# Patient Record
Sex: Male | Born: 1985 | Race: Black or African American | Hispanic: No | Marital: Single | Smoking: Never smoker
Health system: Southern US, Community
[De-identification: ages and names within clinical notes are randomized; demographics above are authoritative.]

## PROBLEM LIST (undated history)

## (undated) DIAGNOSIS — K219 Gastro-esophageal reflux disease without esophagitis: Secondary | ICD-10-CM

## (undated) HISTORY — PX: UPPER GASTROINTESTINAL ENDOSCOPY: SHX188

## (undated) HISTORY — DX: Gastro-esophageal reflux disease without esophagitis: K21.9

---
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
## 2003-08-11 ENCOUNTER — Emergency Department (HOSPITAL_COMMUNITY): Admission: EM | Admit: 2003-08-11 | Discharge: 2003-08-11 | Payer: Self-pay | Admitting: Emergency Medicine

## 2004-07-08 ENCOUNTER — Ambulatory Visit: Payer: Self-pay | Admitting: Internal Medicine

## 2004-07-14 ENCOUNTER — Ambulatory Visit: Payer: Self-pay | Admitting: *Deleted

## 2010-06-14 ENCOUNTER — Ambulatory Visit (HOSPITAL_COMMUNITY)
Admission: RE | Admit: 2010-06-14 | Discharge: 2010-06-14 | Disposition: A | Discharge status: Home / Self Care | Payer: Self-pay | Source: Ambulatory Visit | Attending: Family Medicine | Admitting: Family Medicine

## 2010-06-14 ENCOUNTER — Other Ambulatory Visit (HOSPITAL_COMMUNITY): Payer: Self-pay | Admitting: Family Medicine

## 2010-06-14 DIAGNOSIS — M25519 Pain in unspecified shoulder: Secondary | ICD-10-CM | POA: Insufficient documentation

## 2010-06-14 DIAGNOSIS — R52 Pain, unspecified: Secondary | ICD-10-CM

## 2010-08-18 ENCOUNTER — Ambulatory Visit: Payer: Self-pay | Attending: Family Medicine | Admitting: Physical Therapy

## 2010-08-18 DIAGNOSIS — M25619 Stiffness of unspecified shoulder, not elsewhere classified: Secondary | ICD-10-CM | POA: Insufficient documentation

## 2010-08-18 DIAGNOSIS — M25519 Pain in unspecified shoulder: Secondary | ICD-10-CM | POA: Insufficient documentation

## 2010-08-18 DIAGNOSIS — IMO0001 Reserved for inherently not codable concepts without codable children: Secondary | ICD-10-CM | POA: Insufficient documentation

## 2010-08-23 ENCOUNTER — Ambulatory Visit: Payer: Self-pay | Admitting: Physical Therapy

## 2010-08-25 ENCOUNTER — Ambulatory Visit: Payer: Self-pay | Admitting: Physical Therapy

## 2010-08-31 ENCOUNTER — Ambulatory Visit: Payer: Self-pay | Admitting: Physical Therapy

## 2010-09-02 ENCOUNTER — Ambulatory Visit: Payer: Self-pay | Admitting: Physical Therapy

## 2010-09-07 ENCOUNTER — Ambulatory Visit: Payer: Self-pay | Attending: Family Medicine | Admitting: Physical Therapy

## 2010-09-07 DIAGNOSIS — M25519 Pain in unspecified shoulder: Secondary | ICD-10-CM | POA: Insufficient documentation

## 2010-09-07 DIAGNOSIS — M25619 Stiffness of unspecified shoulder, not elsewhere classified: Secondary | ICD-10-CM | POA: Insufficient documentation

## 2010-09-07 DIAGNOSIS — IMO0001 Reserved for inherently not codable concepts without codable children: Secondary | ICD-10-CM | POA: Insufficient documentation

## 2010-09-10 ENCOUNTER — Ambulatory Visit: Payer: Self-pay | Admitting: Physical Therapy

## 2010-09-14 ENCOUNTER — Ambulatory Visit: Payer: Self-pay | Admitting: Physical Therapy

## 2010-09-16 ENCOUNTER — Encounter: Payer: Self-pay | Admitting: Physical Therapy

## 2010-09-21 ENCOUNTER — Ambulatory Visit: Payer: Self-pay | Admitting: Physical Therapy

## 2010-09-23 ENCOUNTER — Encounter: Payer: Self-pay | Admitting: Physical Therapy

## 2012-11-02 ENCOUNTER — Encounter (HOSPITAL_COMMUNITY): Payer: Self-pay | Admitting: Emergency Medicine

## 2012-11-02 ENCOUNTER — Emergency Department (HOSPITAL_COMMUNITY)
Admission: EM | Admit: 2012-11-02 | Discharge: 2012-11-02 | Disposition: A | Discharge status: Home / Self Care | Payer: No Typology Code available for payment source | Attending: Emergency Medicine | Admitting: Emergency Medicine

## 2012-11-02 DIAGNOSIS — Y92410 Unspecified street and highway as the place of occurrence of the external cause: Secondary | ICD-10-CM | POA: Insufficient documentation

## 2012-11-02 DIAGNOSIS — IMO0002 Reserved for concepts with insufficient information to code with codable children: Secondary | ICD-10-CM | POA: Insufficient documentation

## 2012-11-02 DIAGNOSIS — Y9389 Activity, other specified: Secondary | ICD-10-CM | POA: Insufficient documentation

## 2012-11-02 DIAGNOSIS — M549 Dorsalgia, unspecified: Secondary | ICD-10-CM

## 2012-11-02 MED ORDER — HYDROCODONE-ACETAMINOPHEN 5-325 MG PO TABS: 2.0000 | ORAL_TABLET | ORAL | Status: DC | PRN

## 2012-11-02 MED ORDER — NAPROXEN 500 MG PO TABS: 500.0000 mg | ORAL_TABLET | Freq: Two times a day (BID) | ORAL | Status: DC

## 2012-11-02 MED ORDER — METHOCARBAMOL 500 MG PO TABS: 500.0000 mg | ORAL_TABLET | Freq: Two times a day (BID) | ORAL | Status: DC
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
## 2012-11-02 NOTE — ED Provider Notes (Signed)
Medical screening examination/treatment/procedure(s) were performed by non-physician practitioner and as supervising physician I was immediately available for consultation/collaboration.   Hurman Horn, MD 11/02/12 [phone]
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
## 2017-01-11 ENCOUNTER — Encounter (HOSPITAL_COMMUNITY): Payer: Self-pay | Admitting: Emergency Medicine

## 2017-01-11 ENCOUNTER — Ambulatory Visit (HOSPITAL_COMMUNITY)
Admission: EM | Admit: 2017-01-11 | Discharge: 2017-01-11 | Disposition: A | Discharge status: Home / Self Care | Payer: Self-pay | Attending: Family Medicine | Admitting: Family Medicine

## 2017-01-11 DIAGNOSIS — M542 Cervicalgia: Secondary | ICD-10-CM

## 2017-01-11 DIAGNOSIS — S39012A Strain of muscle, fascia and tendon of lower back, initial encounter: Secondary | ICD-10-CM

## 2017-01-11 MED ORDER — NAPROXEN 500 MG PO TABS: 500.0000 mg | ORAL_TABLET | Freq: Two times a day (BID) | ORAL | 0 refills | Status: DC | PRN

## 2017-01-11 MED ORDER — CYCLOBENZAPRINE HCL 10 MG PO TABS: ORAL_TABLET | ORAL | 0 refills | Status: DC
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
## 2017-01-11 NOTE — Discharge Instructions (Addendum)
Recommend start Naproxen  twice a day as directed for pain and swelling. May take Flexeril muscle relaxer- take 1/2 to 1 whole tablet by  mouth up to 3 times a day as needed- will cause drowsiness. Recommend apply warm compresses to area to help with comfort. Follow-up in 5 to 7 days for recheck if not improving.
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
## 2017-04-03 ENCOUNTER — Ambulatory Visit (HOSPITAL_COMMUNITY)
Admission: EM | Admit: 2017-04-03 | Discharge: 2017-04-03 | Disposition: A | Discharge status: Home / Self Care | Payer: BLUE CROSS/BLUE SHIELD | Attending: Family Medicine | Admitting: Family Medicine

## 2017-04-03 ENCOUNTER — Other Ambulatory Visit: Payer: Self-pay

## 2017-04-03 ENCOUNTER — Encounter (HOSPITAL_COMMUNITY): Payer: Self-pay | Admitting: Emergency Medicine

## 2017-04-03 DIAGNOSIS — Z113 Encounter for screening for infections with a predominantly sexual mode of transmission: Secondary | ICD-10-CM | POA: Diagnosis not present

## 2017-04-03 DIAGNOSIS — Z202 Contact with and (suspected) exposure to infections with a predominantly sexual mode of transmission: Secondary | ICD-10-CM | POA: Diagnosis not present

## 2017-04-03 DIAGNOSIS — R369 Urethral discharge, unspecified: Secondary | ICD-10-CM | POA: Diagnosis not present

## 2017-04-03 MED ORDER — AZITHROMYCIN 250 MG PO TABS
ORAL_TABLET | ORAL | Status: AC
  Filled 2017-04-03: qty 4

## 2017-04-03 MED ORDER — CEFTRIAXONE SODIUM 250 MG IJ SOLR
250.0000 mg | Freq: Once | INTRAMUSCULAR | Status: AC
  Administered 2017-04-03: 250 mg via INTRAMUSCULAR

## 2017-04-03 MED ORDER — CEFTRIAXONE SODIUM 250 MG IJ SOLR
INTRAMUSCULAR | Status: AC
  Filled 2017-04-03: qty 250

## 2017-04-03 MED ORDER — AZITHROMYCIN 250 MG PO TABS
1000.0000 mg | ORAL_TABLET | Freq: Once | ORAL | Status: AC
  Administered 2017-04-03: 1000 mg via ORAL

## 2017-04-03 NOTE — ED Triage Notes (Signed)
Pt states his gf got tested and was positive for gonhorrea, c/o discharge and stomach pain. x1 week.

## 2017-04-03 NOTE — Discharge Instructions (Signed)
You have been given the following medications today for treatment of suspected gonorrhea and/or chlamydia: ° °cefTRIAXone (ROCEPHIN) injection 250 mg °azithromycin (ZITHROMAX) tablet 1,000 mg ° °Even though we have treated you today, we have sent testing for sexually transmitted infections. We will notify you of any positive results once they are received. If required, we will prescribe any medications you might need. ° °

## 2017-04-04 NOTE — ED Provider Notes (Signed)
  [facility]   [id number] 04/03/17 Arrival Time: 1007  ASSESSMENT & PLAN:  1. Exposure to STD     Meds ordered this encounter  Medications  . cefTRIAXone (ROCEPHIN) injection 250 mg  . azithromycin (ZITHROMAX) tablet 1,000 mg   Urine cytology sent. Will notify of any positive results. Instructed to refrain from sexual activity for at least seven days.  Reviewed expectations re: course of current medical issues. Questions answered. Outlined signs and symptoms indicating need for more acute intervention. Patient verbalized understanding. After Visit Summary given.   SUBJECTIVE:  Brandon Olson is a 32 y.o. male who presents with complaint of penile discharge. Onset gradual, 1 week ago. Describes discharge as thick and white/yellow. Urinary symptoms: none. Afebrile. No abdominal or pelvic pain. No n/v. No rashes or lesions. Sexually active with single male partner. OTC treatment: None. Girlfriend has tested + for gonorrhea. History of similar symptoms: No.   ROS: As per HPI.  OBJECTIVE:  Vitals:   04/03/17 1044  BP: 119/71  Pulse: (!) 57  Resp: 16  Temp: 98.4 F (36.9 C)  SpO2: 100%     General appearance: alert, cooperative, appears stated age and no distress Throat: lips, mucosa, and tongue normal; teeth and gums normal Back: no CVA tenderness Abdomen: soft, non-tender; bowel sounds normal; no masses or organomegaly; no guarding or rebound tenderness GU: declines Skin: warm and dry Psychological:  Alert and cooperative. Normal mood and affect.    Labs Reviewed  URINE CYTOLOGY ANCILLARY ONLY     Allergies  Allergen Reactions  . Shrimp [Shellfish Allergy] Nausea Only    Social History   Socioeconomic History  . Marital status: Single    Spouse name: Not on file  . Number of children: Not on file  . Years of education: Not on file  . Highest education level: Not on file  Social Needs  . Financial resource strain: Not on file  . Food  insecurity - worry: Not on file  . Food insecurity - inability: Not on file  . Transportation needs - medical: Not on file  . Transportation needs - non-medical: Not on file  Occupational History  . Not on file  Tobacco Use  . Smoking status: Never Smoker  . Smokeless tobacco: Never Used  Substance and Sexual Activity  . Alcohol use: Yes  . Drug use: No  . Sexual activity: Not on file  Other Topics Concern  . Not on file  Social History Narrative  . Not on file          Mardella LaymanHagler, Germain Koopmann, MD 04/04/17 1007

## 2017-04-05 LAB — URINE CYTOLOGY ANCILLARY ONLY
CHLAMYDIA, DNA PROBE: NEGATIVE
NEISSERIA GONORRHEA: NEGATIVE
TRICH (WINDOWPATH): NEGATIVE

## 2017-04-13 ENCOUNTER — Ambulatory Visit (HOSPITAL_COMMUNITY)
Admission: EM | Admit: 2017-04-13 | Discharge: 2017-04-13 | Disposition: A | Discharge status: Home / Self Care | Payer: BLUE CROSS/BLUE SHIELD | Attending: Family Medicine | Admitting: Family Medicine

## 2017-04-13 ENCOUNTER — Other Ambulatory Visit: Payer: Self-pay

## 2017-04-13 ENCOUNTER — Encounter (HOSPITAL_COMMUNITY): Payer: Self-pay | Admitting: Emergency Medicine

## 2017-04-13 DIAGNOSIS — R369 Urethral discharge, unspecified: Secondary | ICD-10-CM | POA: Diagnosis not present

## 2017-04-13 DIAGNOSIS — R809 Proteinuria, unspecified: Secondary | ICD-10-CM | POA: Diagnosis not present

## 2017-04-13 DIAGNOSIS — R195 Other fecal abnormalities: Secondary | ICD-10-CM | POA: Diagnosis not present

## 2017-04-13 LAB — POCT I-STAT, CHEM 8
BUN: 17 mg/dL (ref 6–20)
CHLORIDE: 102 mmol/L (ref 101–111)
Calcium, Ion: 1.23 mmol/L (ref 1.15–1.40)
Creatinine, Ser: 1.2 mg/dL (ref 0.61–1.24)
GLUCOSE: 82 mg/dL (ref 65–99)
HCT: 41 % (ref 39.0–52.0)
Hemoglobin: 13.9 g/dL (ref 13.0–17.0)
POTASSIUM: 3.8 mmol/L (ref 3.5–5.1)
SODIUM: 141 mmol/L (ref 135–145)
TCO2: 27 mmol/L (ref 22–32)

## 2017-04-13 LAB — POCT URINALYSIS DIP (DEVICE)
BILIRUBIN URINE: NEGATIVE
Glucose, UA: NEGATIVE mg/dL
Ketones, ur: NEGATIVE mg/dL
LEUKOCYTES UA: NEGATIVE
Nitrite: NEGATIVE
Protein, ur: >=300 mg/dL — AB
Specific Gravity, Urine: >=1.03 (ref 1.005–1.030)
Urobilinogen, UA: 1 mg/dL (ref 0.0–1.0)
pH: 6 (ref 5.0–8.0)

## 2017-04-13 MED ORDER — SULFAMETHOXAZOLE-TRIMETHOPRIM 800-160 MG PO TABS: 1.0000 | ORAL_TABLET | Freq: Two times a day (BID) | ORAL | 0 refills | Status: AC

## 2017-04-13 NOTE — ED Provider Notes (Signed)
[facility]   [id number] 04/13/17 Arrival Time: 1607  ASSESSMENT & PLAN:  1. Loose stools   2. Penile discharge   3. Proteinuria, unspecified type     Meds ordered this encounter  Medications  . sulfamethoxazole-trimethoprim (BACTRIM DS,SEPTRA DS) 800-160 MG tablet    Sig: Take 1 tablet by mouth 2 (two) times daily for 10 days.    Dispense:  20 tablet    Refill:  0   Question if scant penile drainage prostate related. Recently empirically treated for gonorrhea and testing for gonorrhea returned negative.  I am not sure what is causing his loose stools. Discussed diet modification. As for proteinuria, I recommend that he establish care with a PCP for further evaluation. Kidney function normal tonight.  Reviewed expectations re: course of current medical issues. Questions answered. Outlined signs and symptoms indicating need for more acute intervention. Patient verbalized understanding. After Visit Summary given.   SUBJECTIVE: History from: patient. Brandon Olson is a 32 y.o. male who presents with complaint of continued penile discharge. Describes as clear and sometimes slightly white. Notices more after he is sitting for a bowel movement. No bleeding. BMs are normal. No rashes. Recently tx for GC/Chlamydia and testing returned negative. No urinary symptoms. Afebrile. No abdominal symptoms. Normal PO intake.  Does mention that stools have been "looser than normal" for a few weeks. No change in frequency. No diet changes.  No OTC treatment.  ROS: As per HPI.   OBJECTIVE:  Vitals:   04/13/17 1631  BP: 126/80  Pulse: 60  Resp: 18  Temp: 98.7 F (37.1 C)  SpO2: 100%    General appearance: alert; no distress Abdomen: soft, non-tender; bowel sounds normal; no masses or organomegaly; no guarding or rebound tenderness Back: no CVA tenderness Prostate: declined Extremities: no cyanosis or edema; symmetrical with no gross deformities Skin: warm and  dry Psychological: alert and cooperative; normal mood and affect  Labs: Results for orders placed or performed during the hospital encounter of 04/13/17  POCT urinalysis dip (device)  Result Value Ref Range   Glucose, UA NEGATIVE NEGATIVE mg/dL   Bilirubin Urine NEGATIVE NEGATIVE   Ketones, ur NEGATIVE NEGATIVE mg/dL   Specific Gravity, Urine >=1.030 1.005 - 1.030   Hgb urine dipstick TRACE (A) NEGATIVE   pH 6.0 5.0 - 8.0   Protein, ur >=300 (A) NEGATIVE mg/dL   Urobilinogen, UA 1.0 0.0 - 1.0 mg/dL   Nitrite NEGATIVE NEGATIVE   Leukocytes, UA NEGATIVE NEGATIVE  I-STAT, chem 8  Result Value Ref Range   Sodium 141 135 - 145 mmol/L   Potassium 3.8 3.5 - 5.1 mmol/L   Chloride 102 101 - 111 mmol/L   BUN 17 6 - 20 mg/dL   Creatinine, Ser 4.09 0.61 - 1.24 mg/dL   Glucose, Bld 82 65 - 99 mg/dL   Calcium, Ion 8.11 9.14 - 1.40 mmol/L   TCO2 27 22 - 32 mmol/L   Hemoglobin 13.9 13.0 - 17.0 g/dL   HCT 78.2 95.6 - 21.3 %   Labs Reviewed  POCT URINALYSIS DIP (DEVICE) - Abnormal; Notable for the following components:      Result Value   Hgb urine dipstick TRACE (*)    Protein, ur >=300 (*)    All other components within normal limits  POCT I-STAT, CHEM 8    Allergies  Allergen Reactions  . Shrimp [Shellfish Allergy] Nausea Only    Social History   Socioeconomic History  . Marital status: Single  Spouse name: Not on file  . Number of children: Not on file  . Years of education: Not on file  . Highest education level: Not on file  Social Needs  . Financial resource strain: Not on file  . Food insecurity - worry: Not on file  . Food insecurity - inability: Not on file  . Transportation needs - medical: Not on file  . Transportation needs - non-medical: Not on file  Occupational History  . Not on file  Tobacco Use  . Smoking status: Never Smoker  . Smokeless tobacco: Never Used  Substance and Sexual Activity  . Alcohol use: Yes  . Drug use: No  . Sexual activity: Not  on file  Other Topics Concern  . Not on file  Social History Narrative  . Not on file   No PMH of bowel problems.   Mardella LaymanHagler, Dorena Dorfman, MD 04/18/17 [phone]

## 2017-04-13 NOTE — ED Triage Notes (Signed)
Pt c/o abdominal pain with loss of appetite. Pt states when he sits down and goes to the bathroom, states he's had loose stools. x2-3 weeks. Pt states he's been smoking weed but he's since stopped to try and get his appetite back. Pt was here recently for a screening of gonorrhea. Was given the shot and pills but still has symptoms, also still is seeing discharge. His testing came back negative.

## 2017-07-26 ENCOUNTER — Ambulatory Visit (HOSPITAL_COMMUNITY)
Admission: EM | Admit: 2017-07-26 | Discharge: 2017-07-26 | Disposition: A | Discharge status: Home / Self Care | Payer: No Typology Code available for payment source | Attending: Family Medicine | Admitting: Family Medicine

## 2017-07-26 ENCOUNTER — Encounter (HOSPITAL_COMMUNITY): Payer: Self-pay | Admitting: Emergency Medicine

## 2017-07-26 DIAGNOSIS — R369 Urethral discharge, unspecified: Secondary | ICD-10-CM | POA: Diagnosis not present

## 2017-07-26 DIAGNOSIS — Z113 Encounter for screening for infections with a predominantly sexual mode of transmission: Secondary | ICD-10-CM

## 2017-07-26 DIAGNOSIS — Z202 Contact with and (suspected) exposure to infections with a predominantly sexual mode of transmission: Secondary | ICD-10-CM | POA: Diagnosis not present

## 2017-07-26 MED ORDER — AZITHROMYCIN 250 MG PO TABS
1000.0000 mg | ORAL_TABLET | Freq: Once | ORAL | Status: AC
  Administered 2017-07-26: 1000 mg via ORAL

## 2017-07-26 MED ORDER — CEFTRIAXONE SODIUM 250 MG IJ SOLR
INTRAMUSCULAR | Status: AC
  Filled 2017-07-26: qty 250

## 2017-07-26 MED ORDER — AZITHROMYCIN 250 MG PO TABS
ORAL_TABLET | ORAL | Status: AC
  Filled 2017-07-26: qty 4

## 2017-07-26 MED ORDER — CEFTRIAXONE SODIUM 250 MG IJ SOLR
250.0000 mg | Freq: Once | INTRAMUSCULAR | Status: AC
  Administered 2017-07-26: 250 mg via INTRAMUSCULAR

## 2017-07-26 NOTE — ED Triage Notes (Signed)
Pt states he was sexually active with someone who tested positive for gonorrhea, pt c/o penile discharge and stomach cramping.

## 2017-07-26 NOTE — ED Provider Notes (Signed)
[facility]   [id number] 07/26/17 Arrival Time: 1433  ASSESSMENT & PLAN:  1. STD exposure     Meds ordered this encounter  Medications  . cefTRIAXone (ROCEPHIN) injection 250 mg  . azithromycin (ZITHROMAX) tablet 1,000 mg    Pending: Labs Reviewed  URINE CYTOLOGY ANCILLARY ONLY   Will notify of any positive results. Instructed to refrain from sexual activity for at least seven days.  Reviewed expectations re: course of current medical issues. Questions answered. Outlined signs and symptoms indicating need for more acute intervention. Patient verbalized understanding. After Visit Summary given.   SUBJECTIVE:  Brandon Olson is a 32 y.o. male who reports possible STD exposure. Informed by male partner that she has recently tested + for gonorrhea. He reports noticing slight penile d/c this am. H/O gonorrhea x 1 in the past; treated. Urinary symptoms: none. Afebrile. No abdominal or pelvic pain. No n/v. No rashes or lesions. Currently sexually active with single male partner. Does report mild abdominal cramping today.  ROS: As per HPI.  OBJECTIVE:  Vitals:   07/26/17 1459  BP: 126/67  Pulse: 69  Resp: 18  Temp: 98.6 F (37 C)  SpO2: 98%    General appearance: alert, cooperative, appears stated age and no distress Throat: lips, mucosa, and tongue normal; teeth and gums normal Back: no CVA tenderness Abdomen: soft, non-tender; bowel sounds normal; no masses or organomegaly; no guarding or rebound tenderness GU: declines Skin: warm and dry Psychological:  Alert and cooperative. Normal mood and affect.    Labs Reviewed  URINE CYTOLOGY ANCILLARY ONLY    Allergies  Allergen Reactions  . Shrimp [Shellfish Allergy] Nausea Only    History reviewed. No pertinent past medical history. No family history on file. Social History   Socioeconomic History  . Marital status: Single    Spouse name: Not on file  . Number of children: Not on file  . Years  of education: Not on file  . Highest education level: Not on file  Occupational History  . Not on file  Social Needs  . Financial resource strain: Not on file  . Food insecurity:    Worry: Not on file    Inability: Not on file  . Transportation needs:    Medical: Not on file    Non-medical: Not on file  Tobacco Use  . Smoking status: Never Smoker  . Smokeless tobacco: Never Used  Substance and Sexual Activity  . Alcohol use: Yes  . Drug use: No  . Sexual activity: Not on file  Lifestyle  . Physical activity:    Days per week: Not on file    Minutes per session: Not on file  . Stress: Not on file  Relationships  . Social connections:    Talks on phone: Not on file    Gets together: Not on file    Attends religious service: Not on file    Active member of club or organization: Not on file    Attends meetings of clubs or organizations: Not on file    Relationship status: Not on file  . Intimate partner violence:    Fear of current or ex partner: Not on file    Emotionally abused: Not on file    Physically abused: Not on file    Forced sexual activity: Not on file  Other Topics Concern  . Not on file  Social History Narrative  . Not on file          [place],  Arlys JohnBrian, MD 07/26/17 [phone]

## 2017-07-26 NOTE — Discharge Instructions (Addendum)

## 2017-07-27 ENCOUNTER — Telehealth (HOSPITAL_COMMUNITY): Payer: Self-pay

## 2017-07-27 LAB — URINE CYTOLOGY ANCILLARY ONLY
Chlamydia: NEGATIVE
Neisseria Gonorrhea: NEGATIVE
Trichomonas: NEGATIVE

## 2017-07-27 NOTE — Telephone Encounter (Signed)
Attempted to reach patient regarding results. No answer. Message sent in MyChart.

## 2017-08-17 ENCOUNTER — Emergency Department (HOSPITAL_COMMUNITY): Payer: No Typology Code available for payment source

## 2017-08-17 ENCOUNTER — Other Ambulatory Visit: Payer: Self-pay

## 2017-08-17 ENCOUNTER — Emergency Department (HOSPITAL_COMMUNITY)
Admission: EM | Admit: 2017-08-17 | Discharge: 2017-08-17 | Disposition: A | Discharge status: Home / Self Care | Payer: No Typology Code available for payment source | Attending: Emergency Medicine | Admitting: Emergency Medicine

## 2017-08-17 DIAGNOSIS — M542 Cervicalgia: Secondary | ICD-10-CM | POA: Insufficient documentation

## 2017-08-17 DIAGNOSIS — M545 Low back pain: Secondary | ICD-10-CM | POA: Insufficient documentation

## 2017-08-17 DIAGNOSIS — Z23 Encounter for immunization: Secondary | ICD-10-CM | POA: Diagnosis not present

## 2017-08-17 MED ORDER — IBUPROFEN 600 MG PO TABS: 600.0000 mg | ORAL_TABLET | Freq: Four times a day (QID) | ORAL | 0 refills | Status: DC | PRN

## 2017-08-17 MED ORDER — ACETAMINOPHEN 500 MG PO TABS: 500.0000 mg | ORAL_TABLET | Freq: Four times a day (QID) | ORAL | 0 refills | Status: DC | PRN

## 2017-08-17 MED ORDER — TETANUS-DIPHTH-ACELL PERTUSSIS 5-2.5-18.5 LF-MCG/0.5 IM SUSP
0.5000 mL | Freq: Once | INTRAMUSCULAR | Status: AC
  Administered 2017-08-17: 0.5 mL via INTRAMUSCULAR
  Filled 2017-08-17: qty 0.5

## 2017-08-17 MED ORDER — CYCLOBENZAPRINE HCL 10 MG PO TABS: 10.0000 mg | ORAL_TABLET | Freq: Two times a day (BID) | ORAL | 0 refills | Status: DC | PRN
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
## 2017-08-17 NOTE — ED Notes (Signed)
Patient transported to X-ray 
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
## 2017-08-17 NOTE — ED Provider Notes (Signed)
Patient care transferred at end of shift from [facility], PA-C pending plain films of the hip.  Plan to discharge home with symptomatic relief if negative.  X-ray of the hip was negative and patient discharged home.   Georgiana Shore, PA-C 08/17/17 1805    Tilden Fossa, MD 08/18/17 [phone]

## 2017-09-13 ENCOUNTER — Telehealth: Payer: Self-pay

## 2017-09-13 NOTE — Telephone Encounter (Signed)
Copied from CRM [phone]#[id number]. Topic: Appointment Scheduling - New Patient >> Sep 13, 2017 10:40 AM Oneal GroutSebastian, Jennifer S wrote: New patient has been scheduled for your office. Provider: Gurney MaxinMike Mani Date of Appointment: 09/15/17  Route to department's PEC pool.  This practice does not mail welcome letter and new pt paperwork.

## 2017-09-15 ENCOUNTER — Ambulatory Visit (INDEPENDENT_AMBULATORY_CARE_PROVIDER_SITE_OTHER): Payer: No Typology Code available for payment source | Admitting: Urgent Care

## 2017-09-15 ENCOUNTER — Other Ambulatory Visit: Payer: Self-pay

## 2017-09-15 ENCOUNTER — Ambulatory Visit (INDEPENDENT_AMBULATORY_CARE_PROVIDER_SITE_OTHER): Payer: No Typology Code available for payment source

## 2017-09-15 ENCOUNTER — Encounter: Payer: Self-pay | Admitting: Urgent Care

## 2017-09-15 DIAGNOSIS — M545 Low back pain, unspecified: Secondary | ICD-10-CM

## 2017-09-15 DIAGNOSIS — M542 Cervicalgia: Secondary | ICD-10-CM

## 2017-09-15 DIAGNOSIS — M25562 Pain in left knee: Secondary | ICD-10-CM

## 2017-09-15 DIAGNOSIS — M25511 Pain in right shoulder: Secondary | ICD-10-CM

## 2017-09-15 MED ORDER — PREDNISONE 10 MG PO TABS: 40.0000 mg | ORAL_TABLET | Freq: Every day | ORAL | 0 refills | Status: DC

## 2017-09-15 MED ORDER — CYCLOBENZAPRINE HCL 10 MG PO TABS: 10.0000 mg | ORAL_TABLET | Freq: Two times a day (BID) | ORAL | 0 refills | Status: DC | PRN
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
## 2017-09-22 ENCOUNTER — Ambulatory Visit: Payer: No Typology Code available for payment source | Admitting: Urgent Care

## 2017-09-26 ENCOUNTER — Encounter: Payer: Self-pay | Admitting: Urgent Care

## 2017-09-26 ENCOUNTER — Ambulatory Visit (INDEPENDENT_AMBULATORY_CARE_PROVIDER_SITE_OTHER): Payer: No Typology Code available for payment source | Admitting: Urgent Care

## 2017-09-26 VITALS — BP 123/77 | HR 68 | Temp 98.6°F | Resp 17 | Ht 63.0 in | Wt 151.0 lb

## 2017-09-26 DIAGNOSIS — M545 Low back pain, unspecified: Secondary | ICD-10-CM

## 2017-09-26 DIAGNOSIS — M5030 Other cervical disc degeneration, unspecified cervical region: Secondary | ICD-10-CM | POA: Diagnosis not present

## 2017-09-26 DIAGNOSIS — M542 Cervicalgia: Secondary | ICD-10-CM | POA: Diagnosis not present

## 2017-09-26 MED ORDER — MELOXICAM 7.5 MG PO TABS: 7.5000 mg | ORAL_TABLET | Freq: Every day | ORAL | 0 refills | Status: DC
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
## 2017-09-26 NOTE — Patient Instructions (Addendum)
For a consult of physical therapy, please contact [facility].  [phone] Please call Julieanne Cotton, Office Manager to set up an appointment.   Degenerative Disk Disease Degenerative disk disease is a condition caused by the changes that occur in spinal disks as you grow older. Spinal disks are soft and compressible disks located between the bones of your spine (vertebrae). These disks act like shock absorbers. Degenerative disk disease can affect the whole spine. However, the neck and lower back are most commonly affected. Many changes can occur in the spinal disks with aging, such as:  The spinal disks may dry and shrink.  Small tears may occur in the tough, outer covering of the disk (annulus).  The disk space may become smaller due to loss of water.  Abnormal growths in the bone (spurs) may occur. This can put pressure on the nerve roots exiting the spinal canal, causing pain.  The spinal canal may become narrowed.  What increases the risk?  Being overweight.  Having a family history of degenerative disk disease.  Smoking.  There is increased risk if you are doing heavy lifting or have a sudden injury. What are the signs or symptoms? Symptoms vary from person to person and may include:  Pain that varies in intensity. Some people have no pain, while others have severe pain. The location of the pain depends on the part of your backbone that is affected. ? You will have neck or arm pain if a disk in the neck area is affected. ? You will have pain in your back, buttocks, or legs if a disk in the lower back is affected.  Pain that becomes worse while bending, reaching up, or with twisting movements.  Pain that may start gradually and then get worse as time passes. It may also start after a major or minor injury.  Numbness or tingling in the arms or legs.  How is this diagnosed? Your health care provider will ask you about your symptoms and about activities or  habits that may cause the pain. He or she may also ask about any injuries, diseases, or treatments you have had. Your health care provider will examine you to check for the range of movement that is possible in the affected area, to check for strength in your extremities, and to check for sensation in the areas of the arms and legs supplied by different nerve roots. You may also have:  An X-ray of the spine.  Other imaging tests, such as MRI.  How is this treated? Your health care provider will advise you on the best plan for treatment. Treatment may include:  Medicines.  Rehabilitation exercises.  Follow these instructions at home:  Follow proper lifting and walking techniques as advised by your health care provider.  Maintain good posture.  Exercise regularly as advised by your health care provider.  Perform relaxation exercises.  Change your sitting, standing, and sleeping habits as advised by your health care provider.  Change positions frequently.  Lose weight or maintain a healthy weight as advised by your health care provider.  Do not use any tobacco products, including cigarettes, chewing tobacco, or electronic cigarettes. If you need help quitting, ask your health care provider.  Wear supportive footwear.  Take medicines only as directed by your health care provider. Contact a health care provider if:  Your pain does not go away within 1-4 weeks.  You have significant appetite or weight loss. Get help right away if:  Your pain is severe.  You notice weakness in your arms, hands, or legs.  You begin to lose control of your bladder or bowel movements.  You have fevers or night sweats. This information is not intended to replace advice given to you by your health care provider. Make sure you discuss any questions you have with your health care provider. Document Released: 01/16/2007 Document Revised: 08/27/2015 Document Reviewed: 07/23/2013 Elsevier Interactive  Patient Education  2018 [employer]     IF you received an x-ray today, you will receive an invoice from [facility]. Please contact [facility] at [phone] with questions or concerns regarding your invoice.   IF you received labwork today, you will receive an invoice from [facility]. Please contact [facility] at [phone] with questions or concerns regarding your invoice.   Our billing staff will not be able to assist you with questions regarding bills from these companies.  You will be contacted with the lab results as soon as they are available. The fastest way to get your results is to activate your My Chart account. Instructions are located on the last page of this paperwork. If you have not heard from us regarding the results in 2 weeks, please contact this office.

## 2017-11-06 ENCOUNTER — Ambulatory Visit (HOSPITAL_COMMUNITY)
Admission: EM | Admit: 2017-11-06 | Discharge: 2017-11-06 | Disposition: A | Discharge status: Home / Self Care | Payer: No Typology Code available for payment source | Attending: Family Medicine | Admitting: Family Medicine

## 2017-11-06 ENCOUNTER — Encounter (HOSPITAL_COMMUNITY): Payer: Self-pay

## 2017-11-06 DIAGNOSIS — Z202 Contact with and (suspected) exposure to infections with a predominantly sexual mode of transmission: Secondary | ICD-10-CM | POA: Diagnosis not present

## 2017-11-06 DIAGNOSIS — R369 Urethral discharge, unspecified: Secondary | ICD-10-CM

## 2017-11-06 DIAGNOSIS — Z113 Encounter for screening for infections with a predominantly sexual mode of transmission: Secondary | ICD-10-CM

## 2017-11-06 MED ORDER — CEFTRIAXONE SODIUM 250 MG IJ SOLR
INTRAMUSCULAR | Status: AC
  Filled 2017-11-06: qty 250

## 2017-11-06 MED ORDER — CEFTRIAXONE SODIUM 250 MG IJ SOLR
250.0000 mg | Freq: Once | INTRAMUSCULAR | Status: AC
  Administered 2017-11-06: 250 mg via INTRAMUSCULAR

## 2017-11-06 MED ORDER — AZITHROMYCIN 250 MG PO TABS
1000.0000 mg | ORAL_TABLET | Freq: Once | ORAL | Status: AC
  Administered 2017-11-06: 1000 mg via ORAL

## 2017-11-06 MED ORDER — AZITHROMYCIN 250 MG PO TABS
ORAL_TABLET | ORAL | Status: AC
  Filled 2017-11-06: qty 4

## 2017-11-06 NOTE — ED Triage Notes (Signed)
Pt presents for STD testing after exposure 

## 2017-11-06 NOTE — ED Provider Notes (Signed)
[facility]   [id number] 11/06/17 Arrival Time: 1239  ASSESSMENT & PLAN:  1. Possible exposure to STD       Discharge Instructions     You have been given the following medications today for treatment of suspected gonorrhea and/or chlamydia:  cefTRIAXone (ROCEPHIN) injection 250 mg azithromycin (ZITHROMAX) tablet 1,000 mg  Even though we have treated you today, we have sent testing for sexually transmitted infections. We will notify you of any positive results once they are received. If required, we will prescribe any medications you might need.  Please refrain from all sexual activity for at least the next seven days.     Pending: Labs Reviewed  URINE CYTOLOGY ANCILLARY ONLY    Will notify of any positive results. Instructed to refrain from sexual activity for at least seven days.  Reviewed expectations re: course of current medical issues. Questions answered. Outlined signs and symptoms indicating need for more acute intervention. Patient verbalized understanding. After Visit Summary given.   SUBJECTIVE:  Brandon Olson is a 32 y.o. male who presents with concern of STD exposure. New male partner recently. Noticed penile irritation with slight yellowish discharge a few days ago. Urinary symptoms: none. Afebrile. No abdominal or pelvic pain. No n/v. No rashes or lesions. Sexually active with single male partner.  ROS: As per HPI.  OBJECTIVE:  Vitals:   11/06/17 1348  BP: 126/75  Pulse: 65  Resp: 20  Temp: 98 F (36.7 C)  TempSrc: Oral  SpO2: 100%     General appearance: alert, cooperative, appears stated age and no distress Throat: lips, mucosa, and tongue normal; teeth and gums normal Back: no CVA tenderness Abdomen: soft, non-tender Skin: warm and dry Psychological: alert and cooperative. Normal mood and affect.   Labs Reviewed  URINE CYTOLOGY ANCILLARY ONLY    Allergies  Allergen Reactions  . Shrimp [Shellfish Allergy] Nausea  Only    History reviewed. No pertinent past medical history. Family History  Problem Relation Age of Onset  . Healthy Mother   . Healthy Father    Social History   Socioeconomic History  . Marital status: Single    Spouse name: Not on file  . Number of children: Not on file  . Years of education: Not on file  . Highest education level: Not on file  Occupational History  . Not on file  Social Needs  . Financial resource strain: Not on file  . Food insecurity:    Worry: Not on file    Inability: Not on file  . Transportation needs:    Medical: Not on file    Non-medical: Not on file  Tobacco Use  . Smoking status: Never Smoker  . Smokeless tobacco: Never Used  Substance and Sexual Activity  . Alcohol use: Yes  . Drug use: No  . Sexual activity: Not on file  Lifestyle  . Physical activity:    Days per week: Not on file    Minutes per session: Not on file  . Stress: Not on file  Relationships  . Social connections:    Talks on phone: Not on file    Gets together: Not on file    Attends religious service: Not on file    Active member of club or organization: Not on file    Attends meetings of clubs or organizations: Not on file    Relationship status: Not on file  . Intimate partner violence:    Fear of current or ex partner: Not  on file    Emotionally abused: Not on file    Physically abused: Not on file    Forced sexual activity: Not on file  Other Topics Concern  . Not on file  Social History Narrative  . Not on file          Mardella Layman, MD 11/06/17 [phone]

## 2017-11-06 NOTE — Discharge Instructions (Addendum)

## 2017-11-07 LAB — URINE CYTOLOGY ANCILLARY ONLY
CHLAMYDIA, DNA PROBE: NEGATIVE
Neisseria Gonorrhea: NEGATIVE
Trichomonas: NEGATIVE

## 2017-11-08 LAB — URINE CYTOLOGY ANCILLARY ONLY: Candida vaginitis: NEGATIVE

## 2017-12-05 ENCOUNTER — Ambulatory Visit (HOSPITAL_COMMUNITY)
Admission: EM | Admit: 2017-12-05 | Discharge: 2017-12-05 | Disposition: A | Discharge status: Home / Self Care | Payer: No Typology Code available for payment source | Attending: Family Medicine | Admitting: Family Medicine

## 2017-12-05 ENCOUNTER — Encounter (HOSPITAL_COMMUNITY): Payer: Self-pay | Admitting: Emergency Medicine

## 2017-12-05 DIAGNOSIS — R21 Rash and other nonspecific skin eruption: Secondary | ICD-10-CM | POA: Diagnosis not present

## 2017-12-05 DIAGNOSIS — L247 Irritant contact dermatitis due to plants, except food: Secondary | ICD-10-CM | POA: Diagnosis not present

## 2017-12-05 DIAGNOSIS — L249 Irritant contact dermatitis, unspecified cause: Secondary | ICD-10-CM

## 2017-12-05 MED ORDER — METHYLPREDNISOLONE SODIUM SUCC 125 MG IJ SOLR
INTRAMUSCULAR | Status: AC
  Filled 2017-12-05: qty 2

## 2017-12-05 MED ORDER — METHYLPREDNISOLONE SODIUM SUCC 125 MG IJ SOLR
125.0000 mg | Freq: Once | INTRAMUSCULAR | Status: AC
Start: 2017-12-05 — End: 2017-12-05
  Administered 2017-12-05: 125 mg via INTRAMUSCULAR

## 2017-12-05 MED ORDER — CETIRIZINE HCL 10 MG PO CAPS: 10.0000 mg | ORAL_CAPSULE | Freq: Every day | ORAL | 0 refills | Status: DC

## 2017-12-05 MED ORDER — PREDNISONE 50 MG PO TABS: 50.0000 mg | ORAL_TABLET | Freq: Every day | ORAL | 0 refills | Status: AC

## 2017-12-05 NOTE — ED Triage Notes (Signed)
Pt sts rash to left arm x 5 days

## 2017-12-05 NOTE — Discharge Instructions (Signed)
We gave you a shot of Solu-Medrol today Please begin prednisone daily with food on your stomach beginning tomorrow Continue antihistamines of Benadryl and/or Zyrtec/Claritin, sent in generic version of Zyrtec to see if your insurance covers this, if not please get over-the-counter generic I expect this rash to gradually resolve over the next 5 days  Please return if rash worsening, spreading, getting close to the eyes, not improving with treatment, worsening, developing shortness of breath

## 2017-12-06 NOTE — ED Provider Notes (Signed)
[facility]    CSN: [id number] Arrival date & time: 12/05/17  1937     History   Chief Complaint Chief Complaint  Patient presents with  . Rash    HPI Brandon Olson is a 32 y.o. male no contributing past medical history presenting today for evaluation of rash.  Patient states that the rash is been present for the past 4 to 5 days and has continued to spread, began on his upper extremities, spread to his trunk and lower extremities as well as slightly on his face.  States that he was moving a dog house and his grandma's backyard and believes he was exposed to poison ivy.  He has been using calamine lotion without relief.  Denies change in vision or involvement of the eyes.  Denies involvement of oral mucosa.  Denies any new foods, medications or other exposures to hydrating products or detergents.  HPI  History reviewed. No pertinent past medical history.  There are no active problems to display for this patient.   History reviewed. No pertinent surgical history.     Home Medications    Prior to Admission medications   Medication Sig Start Date End Date Taking? Authorizing Provider  Cetirizine HCl 10 MG CAPS Take 1 capsule (10 mg total) by mouth daily for 10 days. 12/05/17 12/15/17  Katelin Kutsch C, PA-C  predniSONE (DELTASONE) 50 MG tablet Take 1 tablet (50 mg total) by mouth daily for 5 days. 12/05/17 12/10/17  Aldonia Keeven, Junius Creamer, PA-C    Family History Family History  Problem Relation Age of Onset  . Healthy Mother   . Healthy Father     Social History Social History   Tobacco Use  . Smoking status: Never Smoker  . Smokeless tobacco: Never Used  Substance Use Topics  . Alcohol use: Yes  . Drug use: No     Allergies   Shrimp [shellfish allergy]   Review of Systems Review of Systems  Constitutional: Negative for fatigue and fever.  Eyes: Negative for redness, itching and visual disturbance.  Respiratory: Negative for shortness of breath.     Cardiovascular: Negative for chest pain and leg swelling.  Gastrointestinal: Negative for nausea and vomiting.  Musculoskeletal: Negative for arthralgias and myalgias.  Skin: Positive for color change and rash. Negative for wound.  Neurological: Negative for dizziness, syncope, weakness, light-headedness and headaches.     Physical Exam Triage Vital Signs ED Triage Vitals [12/05/17 2017]  Enc Vitals Group     BP 111/76     Pulse Rate 62     Resp 18     Temp 98.4 F (36.9 C)     Temp Source Oral     SpO2 99 %     Weight      Height      Head Circumference      Peak Flow      Pain Score 0     Pain Loc      Pain Edu?      Excl. in GC?    No data found.  Updated Vital Signs BP 111/76 (BP Location: Left Arm)   Pulse 62   Temp 98.4 F (36.9 C) (Oral)   Resp 18   SpO2 99%   Visual Acuity Right Eye Distance:   Left Eye Distance:   Bilateral Distance:    Right Eye Near:   Left Eye Near:    Bilateral Near:     Physical Exam  Constitutional: He is oriented  to person, place, and time. He appears well-developed and well-nourished.  No acute distress  HENT:  Head: Normocephalic and atraumatic.  Nose: Nose normal.  Mouth/Throat: Oropharynx is clear and moist.  Posterior pharynx patent, no uvula swelling, no lesions on oral mucosa  Eyes: Conjunctivae are normal.  Neck: Neck supple.  Cardiovascular: Normal rate.  Pulmonary/Chest: Effort normal. No respiratory distress.  Abdominal: He exhibits no distension.  Musculoskeletal: Normal range of motion.  Neurological: He is alert and oriented to person, place, and time.  Skin: Skin is warm and dry.  Erythematous maculopapular rash with occasional vesicular-like lesions to upper extremities, trunk and proximal lower extremities  Psychiatric: He has a normal mood and affect.  Nursing note and vitals reviewed.    UC Treatments / Results  Labs (all labs ordered are listed, but only abnormal results are displayed) Labs  Reviewed - No data to display  EKG None  Radiology No results found.  Procedures Procedures (including critical care time)  Medications Ordered in UC Medications  methylPREDNISolone sodium succinate (SOLU-MEDROL) 125 mg/2 mL injection 125 mg (125 mg Intramuscular Given 12/05/17 2043)    Initial Impression / Assessment and Plan / UC Course  I have reviewed the triage vital signs and the nursing notes.  Pertinent labs & imaging results that were available during my care of the patient were reviewed by me and considered in my medical decision making (see chart for details).     Patient appears to have a contact dermatitis, given significant itching, will provide IM Solu-Medrol, will send home with prednisone daily, antihistamines, calamine lotions.  Follow-up if rash not improving, spreading, worsening, nearing the eyes, developing airway compromise.  Discussed strict return precautions. Patient verbalized understanding and is agreeable with plan.  Final Clinical Impressions(s) / UC Diagnoses   Final diagnoses:  Rash and nonspecific skin eruption  Irritant contact dermatitis, unspecified trigger     Discharge Instructions     We gave you a shot of Solu-Medrol today Please begin prednisone daily with food on your stomach beginning tomorrow Continue antihistamines of Benadryl and/or Zyrtec/Claritin, sent in generic version of Zyrtec to see if your insurance covers this, if not please get over-the-counter generic I expect this rash to gradually resolve over the next 5 days  Please return if rash worsening, spreading, getting close to the eyes, not improving with treatment, worsening, developing shortness of breath   ED Prescriptions    Medication Sig Dispense Auth. Provider   predniSONE (DELTASONE) 50 MG tablet Take 1 tablet (50 mg total) by mouth daily for 5 days. 5 tablet Trevis Eden C, PA-C   Cetirizine HCl 10 MG CAPS Take 1 capsule (10 mg total) by mouth daily for 10  days. 10 capsule Laura-Gasbarro Villegas C, PA-C     Controlled Substance Prescriptions [place] Controlled Substance Registry consulted? Not Applicable   Lew Dawes, [place] 12/06/17 [id number]

## 2018-02-15 ENCOUNTER — Telehealth: Payer: Self-pay | Admitting: Urgent Care

## 2018-02-15 NOTE — Telephone Encounter (Signed)
Copied from CRM [id number]. Topic: General - Other >> Jan 08, 2018  3:56 PM Brandon Olson, Brandon Olson wrote: Reason for CRM: Patient is requesting to speak with Brandon Olson. He said this is regarding his Brandon Olson claim that was sent to the office. This was done around 01/01/18. Call back @ [phone] >> Jan 11, 2018 11:39 AM Brandon Olson, Brandon Olson wrote: Not our patient on 01/01/18 he was [facility] >> Jan 18, 2018 12:48 PM Brandon Olson, Brandon Olson, [name] wrote: Patient calling and is requesting to speak with Brandon Olson. States that he was denied. States that his incident happened in May 2019 and he was seen at [place]. Please advise. Would like Olson call back regarding this issue.  CB#: [phone] >> Jan 18, 2018  3:20 PM Brandon Olson, Brandon Olson wrote: Patient was not [facility] in May 2019 there is no claim for any DOS. He was seen at the ER >> Feb 15, 2018  2:39 PM Brandon Olson, Brandon Olson wrote: Patient called back to request information on his records to [facility] so that an insurance claim can be completed and closed.

## 2018-02-16 NOTE — Telephone Encounter (Signed)
Patient was called and records were sent on 02/15/18

## 2018-03-13 ENCOUNTER — Ambulatory Visit (HOSPITAL_COMMUNITY)
Admission: EM | Admit: 2018-03-13 | Discharge: 2018-03-13 | Disposition: A | Discharge status: Home / Self Care | Payer: No Typology Code available for payment source | Attending: Emergency Medicine | Admitting: Emergency Medicine

## 2018-03-13 ENCOUNTER — Encounter (HOSPITAL_COMMUNITY): Payer: Self-pay

## 2018-03-13 DIAGNOSIS — N4889 Other specified disorders of penis: Secondary | ICD-10-CM | POA: Diagnosis not present

## 2018-03-13 DIAGNOSIS — Z113 Encounter for screening for infections with a predominantly sexual mode of transmission: Secondary | ICD-10-CM | POA: Insufficient documentation

## 2018-03-13 DIAGNOSIS — R369 Urethral discharge, unspecified: Secondary | ICD-10-CM | POA: Diagnosis present

## 2018-03-13 LAB — POCT URINALYSIS DIP (DEVICE)
Bilirubin Urine: NEGATIVE
GLUCOSE, UA: NEGATIVE mg/dL
Hgb urine dipstick: NEGATIVE
Ketones, ur: NEGATIVE mg/dL
NITRITE: NEGATIVE
PROTEIN: NEGATIVE mg/dL
SPECIFIC GRAVITY, URINE: 1.02 (ref 1.005–1.030)
UROBILINOGEN UA: 1 mg/dL (ref 0.0–1.0)
pH: 7 (ref 5.0–8.0)

## 2018-03-13 MED ORDER — CEFTRIAXONE SODIUM 250 MG IJ SOLR
INTRAMUSCULAR | Status: AC
  Filled 2018-03-13: qty 250

## 2018-03-13 MED ORDER — AZITHROMYCIN 250 MG PO TABS
ORAL_TABLET | ORAL | Status: AC
  Filled 2018-03-13: qty 4

## 2018-03-13 MED ORDER — AZITHROMYCIN 250 MG PO TABS
1000.0000 mg | ORAL_TABLET | Freq: Once | ORAL | Status: AC
  Administered 2018-03-13: 1000 mg via ORAL

## 2018-03-13 MED ORDER — CEFTRIAXONE SODIUM 250 MG IJ SOLR
250.0000 mg | Freq: Once | INTRAMUSCULAR | Status: AC
  Administered 2018-03-13: 250 mg via INTRAMUSCULAR

## 2018-03-13 NOTE — ED Triage Notes (Signed)
Pt presents with penile irritation and discharge.

## 2018-03-13 NOTE — ED Provider Notes (Signed)
[facility]    CSN: [id number] Arrival date & time: 03/13/18  1817     History   Chief Complaint Chief Complaint  Patient presents with  . Penis Pain  . Peile Discharge    HPI Brandon Olson is a 32 y.o. male no significant past medical history presenting today for evaluation of dysuria and penile discharge.  Patient states that his symptoms have been going on for the past week and a half.  He has noticed 3 different episodes of discharge.  He has also had discomfort with urination and is feel his urethra irritated.  He has had new partners since his previous STD check.  Denies any testicular pain or scrotal swelling.  Denies rashes or lesions.  Through chart review it appears that he has had episodes of penile discharge previously that he is tested negative for.  Of 1 visit in January/2019 he had greater than 300 protein in his urine and was having discharge more frequently after bowel movements.  Possible discharge related to prostate and was treated with Bactrim.  He does still feel he notices discharge more after bowel movements.  He has never seen urology for this.  HPI  History reviewed. No pertinent past medical history.  There are no active problems to display for this patient.   History reviewed. No pertinent surgical history.     Home Medications    Prior to Admission medications   Medication Sig Start Date End Date Taking? Authorizing Provider  Cetirizine HCl 10 MG CAPS Take 1 capsule (10 mg total) by mouth daily for 10 days. 12/05/17 12/15/17  Savannha Welle, Junius CreamerHallie C, PA-C    Family History Family History  Problem Relation Age of Onset  . Healthy Mother   . Healthy Father     Social History Social History   Tobacco Use  . Smoking status: Never Smoker  . Smokeless tobacco: Never Used  Substance Use Topics  . Alcohol use: Yes  . Drug use: No     Allergies   Shrimp [shellfish allergy]   Review of Systems Review of Systems  Constitutional:  Negative for fever.  HENT: Negative for sore throat.   Respiratory: Negative for shortness of breath.   Cardiovascular: Negative for chest pain.  Gastrointestinal: Negative for abdominal pain, nausea and vomiting.  Genitourinary: Positive for discharge and dysuria. Negative for difficulty urinating, frequency, penile pain, penile swelling, scrotal swelling and testicular pain.  Skin: Negative for rash.  Neurological: Negative for dizziness, light-headedness and headaches.     Physical Exam Triage Vital Signs ED Triage Vitals  Enc Vitals Group     BP 03/13/18 1838 127/70     Pulse Rate 03/13/18 1838 60     Resp 03/13/18 1838 20     Temp 03/13/18 1838 98.3 F (36.8 C)     Temp Source 03/13/18 1838 Oral     SpO2 03/13/18 1838 99 %     Weight --      Height --      Head Circumference --      Peak Flow --      Pain Score 03/13/18 1837 4     Pain Loc --      Pain Edu? --      Excl. in GC? --    No data found.  Updated Vital Signs BP 127/70 (BP Location: Right Arm)   Pulse 60   Temp 98.3 F (36.8 C) (Oral)   Resp 20   SpO2 99%  Visual Acuity Right Eye Distance:   Left Eye Distance:   Bilateral Distance:    Right Eye Near:   Left Eye Near:    Bilateral Near:     Physical Exam  Constitutional: He is oriented to person, place, and time. He appears well-developed and well-nourished.  No acute distress  HENT:  Head: Normocephalic and atraumatic.  Nose: Nose normal.  Eyes: Conjunctivae are normal.  Neck: Neck supple.  Cardiovascular: Normal rate.  Pulmonary/Chest: Effort normal. No respiratory distress.  Abdominal: He exhibits no distension.  Musculoskeletal: Normal range of motion.  Neurological: He is alert and oriented to person, place, and time.  Skin: Skin is warm and dry.  Psychiatric: He has a normal mood and affect.  Nursing note and vitals reviewed.    UC Treatments / Results  Labs (all labs ordered are listed, but only abnormal results are  displayed) Labs Reviewed  POCT URINALYSIS DIP (DEVICE) - Abnormal; Notable for the following components:      Result Value   Leukocytes, UA TRACE (*)    All other components within normal limits  URINE CYTOLOGY ANCILLARY ONLY    EKG None  Radiology No results found.  Procedures Procedures (including critical care time)  Medications Ordered in UC Medications  azithromycin (ZITHROMAX) tablet 1,000 mg (has no administration in time range)  cefTRIAXone (ROCEPHIN) injection 250 mg (has no administration in time range)    Initial Impression / Assessment and Plan / UC Course  I have reviewed the triage vital signs and the nursing notes.  Pertinent labs & imaging results that were available during my care of the patient were reviewed by me and considered in my medical decision making (see chart for details).     No protein in urine today.  Trace leuks.  Given new partners and penile discharge in combination with dysuria will empirically treat for gonorrhea and chlamydia with Rocephin and azithromycin.  Urine cytology obtained and will send off for results.  Will call patient results and provide any further treatment if needed.  Discussed with patient if having persistent penile discharge with negative results to follow-up with urology for further evaluation.Discussed strict return precautions. Patient verbalized understanding and is agreeable with plan.  Final Clinical Impressions(s) / UC Diagnoses   Final diagnoses:  Penile discharge     Discharge Instructions     We have treated you today for gonorrhea and chlamydia, with rocephin and azithromycin. Please refrain from sexual intercourse for 7 days while medicines eliminating infection.   No protein in urine today  We are testing you for Gonorrhea, Chlamydia, Trichomonas, Yeast and Bacterial Vaginosis. We will call you if anything is positive and let you know if you require any further treatment. Please inform partners of any  positive results.   Please return if symptoms not improving with treatment, development of fever, nausea, vomiting, abdominal pain.    ED Prescriptions    None     Controlled Substance Prescriptions [place] Controlled Substance Registry consulted? Not Applicable   Lew Dawes, [place] 03/13/18 1928

## 2018-03-13 NOTE — Discharge Instructions (Signed)
We have treated you today for gonorrhea and chlamydia, with rocephin and azithromycin. Please refrain from sexual intercourse for 7 days while medicines eliminating infection.   No protein in urine today  We are testing you for Gonorrhea, Chlamydia, Trichomonas, Yeast and Bacterial Vaginosis. We will call you if anything is positive and let you know if you require any further treatment. Please inform partners of any positive results.   Please return if symptoms not improving with treatment, development of fever, nausea, vomiting, abdominal pain.

## 2018-03-14 LAB — URINE CYTOLOGY ANCILLARY ONLY
Chlamydia: NEGATIVE
NEISSERIA GONORRHEA: NEGATIVE
Trichomonas: NEGATIVE

## 2018-05-22 ENCOUNTER — Encounter (HOSPITAL_COMMUNITY): Payer: Self-pay

## 2018-05-22 ENCOUNTER — Other Ambulatory Visit: Payer: Self-pay

## 2018-05-22 ENCOUNTER — Ambulatory Visit (HOSPITAL_COMMUNITY)
Admission: EM | Admit: 2018-05-22 | Discharge: 2018-05-22 | Disposition: A | Discharge status: Home / Self Care | Payer: Self-pay | Attending: Family Medicine | Admitting: Family Medicine

## 2018-05-22 DIAGNOSIS — R369 Urethral discharge, unspecified: Secondary | ICD-10-CM | POA: Insufficient documentation

## 2018-05-22 LAB — POCT URINALYSIS DIP (DEVICE)
Glucose, UA: NEGATIVE mg/dL
Hgb urine dipstick: NEGATIVE
Leukocytes,Ua: NEGATIVE
Nitrite: NEGATIVE
PH: 6 (ref 5.0–8.0)
PROTEIN: NEGATIVE mg/dL
Specific Gravity, Urine: 1.025 (ref 1.005–1.030)
UROBILINOGEN UA: 0.2 mg/dL (ref 0.0–1.0)

## 2018-05-22 MED ORDER — CEFTRIAXONE SODIUM 250 MG IJ SOLR
250.0000 mg | Freq: Once | INTRAMUSCULAR | Status: AC
  Administered 2018-05-22: 250 mg via INTRAMUSCULAR

## 2018-05-22 MED ORDER — AZITHROMYCIN 250 MG PO TABS
ORAL_TABLET | ORAL | Status: AC
  Filled 2018-05-22: qty 4

## 2018-05-22 MED ORDER — LIDOCAINE HCL 2 % IJ SOLN
INTRAMUSCULAR | Status: AC
  Filled 2018-05-22: qty 20

## 2018-05-22 MED ORDER — LIDOCAINE HCL (PF) 1 % IJ SOLN
INTRAMUSCULAR | Status: AC
  Filled 2018-05-22: qty 2

## 2018-05-22 MED ORDER — CEFTRIAXONE SODIUM 250 MG IJ SOLR
INTRAMUSCULAR | Status: AC
  Filled 2018-05-22: qty 250

## 2018-05-22 MED ORDER — AZITHROMYCIN 250 MG PO TABS
1000.0000 mg | ORAL_TABLET | Freq: Once | ORAL | Status: AC
  Administered 2018-05-22: 1000 mg via ORAL

## 2018-05-22 NOTE — ED Triage Notes (Signed)
Pt cc stomach pain and penile discharge x 1 week.

## 2018-05-22 NOTE — Discharge Instructions (Signed)
Please follow up with primary care or urology for further evaluation of your symptoms without clear cause  We have treated you today for gonorrhea and chlamydia, with azithromycin and rocephin. Please refrain from sexual activity for 7 days while medicine is clearing infection.  We are testing you for Gonorrhea, Chlamydia and Trichomonas. We will call you if anything is positive and let you know if you require any further treatment. Please inform partner of any positive results.  Please return if symptoms not improving with treatment, development of fever, nausea, vomiting, abdominal pain, scrotal pain.

## 2018-05-22 NOTE — ED Provider Notes (Signed)
[facility]    CSN: [id number] Arrival date & time: 05/22/18  2585     History   Chief Complaint Chief Complaint  Patient presents with  . Penile Discharge  . Abdominal Pain    HPI Brandon Olson is a 33 y.o. male no significant past medical history presenting today for evaluation of penile discharge.  Patient states that he started to have penile discharge over the past week.  He does note approximately 1.5 weeks ago he had intercourse with a partner where the condom broke.  He does feel symptoms started a few days after this.  He is also had some mild abdominal discomfort described as bloating and a gassy sensation.  Feels as if he has had difficulty eating or decreased appetite.  This began around the same time as the discharge.  Denies any rashes or lesions.  He has been seen here multiple times in the past for penile discharge which have had negative STD screenings.  Approximately 1 year ago he had a significant amount of protein in his urine was thought this could be related to the prostate.  2 months ago and today he does not reveal any protein in his urine.  HPI  History reviewed. No pertinent past medical history.  There are no active problems to display for this patient.   History reviewed. No pertinent surgical history.     Home Medications    Prior to Admission medications   Medication Sig Start Date End Date Taking? Authorizing Provider  Cetirizine HCl 10 MG CAPS Take 1 capsule (10 mg total) by mouth daily for 10 days. 12/05/17 12/15/17  Wieters, Junius Creamer, PA-C    Family History Family History  Problem Relation Age of Onset  . Healthy Mother   . Healthy Father     Social History Social History   Tobacco Use  . Smoking status: Never Smoker  . Smokeless tobacco: Never Used  Substance Use Topics  . Alcohol use: Yes  . Drug use: Yes    Types: Marijuana     Allergies   Shrimp [shellfish allergy]   Review of Systems Review of Systems    Constitutional: Negative for fever.  HENT: Negative for sore throat.   Respiratory: Negative for shortness of breath.   Cardiovascular: Negative for chest pain.  Gastrointestinal: Positive for abdominal pain. Negative for nausea and vomiting.  Genitourinary: Positive for discharge. Negative for difficulty urinating, dysuria, frequency, penile pain, penile swelling, scrotal swelling and testicular pain.  Skin: Negative for rash.  Neurological: Negative for dizziness, light-headedness and headaches.     Physical Exam Triage Vital Signs ED Triage Vitals  Enc Vitals Group     BP 05/22/18 0926 124/84     Pulse Rate 05/22/18 0926 76     Resp 05/22/18 0926 18     Temp 05/22/18 0926 97.6 F (36.4 C)     Temp Source 05/22/18 0926 Oral     SpO2 05/22/18 0926 100 %     Weight 05/22/18 0927 140 lb (63.5 kg)     Height --      Head Circumference --      Peak Flow --      Pain Score 05/22/18 0926 8     Pain Loc --      Pain Edu? --      Excl. in [place]? --    No data found.  Updated Vital Signs BP 124/84 (BP Location: Right Arm)   Pulse 76  Temp 97.6 F (36.4 C) (Oral)   Resp 18   Wt 140 lb (63.5 kg)   SpO2 100%   BMI 24.80 kg/m   Visual Acuity Right Eye Distance:   Left Eye Distance:   Bilateral Distance:    Right Eye Near:   Left Eye Near:    Bilateral Near:     Physical Exam Vitals signs and nursing note reviewed.  Constitutional:      Appearance: He is well-developed.     Comments: No acute distress  HENT:     Head: Normocephalic and atraumatic.     Nose: Nose normal.  Eyes:     Conjunctiva/sclera: Conjunctivae normal.  Neck:     Musculoskeletal: Neck supple.  Cardiovascular:     Rate and Rhythm: Normal rate.  Pulmonary:     Effort: Pulmonary effort is normal. No respiratory distress.  Abdominal:     General: There is no distension.     Tenderness: There is abdominal tenderness.     Comments: Mild tenderness to epigastrium as well as bilateral lower  quadrants, no focal tenderness, negative rebound  Musculoskeletal: Normal range of motion.  Skin:    General: Skin is warm and dry.  Neurological:     Mental Status: He is alert and oriented to person, place, and time.      UC Treatments / Results  Labs (all labs ordered are listed, but only abnormal results are displayed) Labs Reviewed  POCT URINALYSIS DIP (DEVICE) - Abnormal; Notable for the following components:      Result Value   Bilirubin Urine SMALL (*)    Ketones, ur TRACE (*)    All other components within normal limits  URINE CYTOLOGY ANCILLARY ONLY    EKG None  Radiology No results found.  Procedures Procedures (including critical care time)  Medications Ordered in UC Medications  cefTRIAXone (ROCEPHIN) injection 250 mg (250 mg Intramuscular Given 05/22/18 1005)  azithromycin (ZITHROMAX) tablet 1,000 mg (1,000 mg Oral Given 05/22/18 1004)    Initial Impression / Assessment and Plan / UC Course  I have reviewed the triage vital signs and the nursing notes.  Pertinent labs & imaging results that were available during my care of the patient were reviewed by me and considered in my medical decision making (see chart for details).     Urine cytology sent, opted to go ahead and treat for gonorrhea and chlamydia today with Rocephin and azithromycin given possible exposure.  Discussed establishing care with primary care and following up with urology for further evaluation of recurrent symptoms and causes of discharge without positive STD results.Discussed strict return precautions. Patient verbalized understanding and is agreeable with plan.  Final Clinical Impressions(s) / UC Diagnoses   Final diagnoses:  Penile discharge     Discharge Instructions     Please follow up with primary care or urology for further evaluation of your symptoms without clear cause  We have treated you today for gonorrhea and chlamydia, with azithromycin and rocephin. Please refrain  from sexual activity for 7 days while medicine is clearing infection.  We are testing you for Gonorrhea, Chlamydia and Trichomonas. We will call you if anything is positive and let you know if you require any further treatment. Please inform partner of any positive results.  Please return if symptoms not improving with treatment, development of fever, nausea, vomiting, abdominal pain, scrotal pain.   ED Prescriptions    None     Controlled Substance Prescriptions [place] Controlled Substance Registry consulted?  Not Applicable   Lew Dawes, [place] 05/22/18 1008

## 2018-05-23 LAB — URINE CYTOLOGY ANCILLARY ONLY
Chlamydia: NEGATIVE
Neisseria Gonorrhea: NEGATIVE
TRICH (WINDOWPATH): NEGATIVE

## 2018-07-24 ENCOUNTER — Encounter (HOSPITAL_COMMUNITY): Payer: Self-pay

## 2018-07-24 ENCOUNTER — Other Ambulatory Visit: Payer: Self-pay

## 2018-07-24 ENCOUNTER — Ambulatory Visit (HOSPITAL_COMMUNITY)
Admission: EM | Admit: 2018-07-24 | Discharge: 2018-07-24 | Disposition: A | Discharge status: Home / Self Care | Payer: Self-pay | Attending: Family Medicine | Admitting: Family Medicine

## 2018-07-24 DIAGNOSIS — R369 Urethral discharge, unspecified: Secondary | ICD-10-CM | POA: Insufficient documentation

## 2018-07-24 MED ORDER — AZITHROMYCIN 250 MG PO TABS
1000.0000 mg | ORAL_TABLET | Freq: Once | ORAL | Status: AC
  Administered 2018-07-24: 1000 mg via ORAL

## 2018-07-24 MED ORDER — AZITHROMYCIN 250 MG PO TABS
ORAL_TABLET | ORAL | Status: AC
  Filled 2018-07-24: qty 4

## 2018-07-24 MED ORDER — CEFTRIAXONE SODIUM 250 MG IJ SOLR
250.0000 mg | Freq: Once | INTRAMUSCULAR | Status: AC
  Administered 2018-07-24: 15:00:00 250 mg via INTRAMUSCULAR

## 2018-07-24 MED ORDER — CEFTRIAXONE SODIUM 250 MG IJ SOLR
INTRAMUSCULAR | Status: AC
  Filled 2018-07-24: qty 250

## 2018-07-24 NOTE — ED Triage Notes (Signed)
Pt presents wanting to get STD Testing after noticing some abnormal penile discharge.

## 2018-07-24 NOTE — Discharge Instructions (Signed)

## 2018-07-24 NOTE — ED Provider Notes (Signed)
[facility]   [id number] 07/24/18 Arrival Time: 1357  ASSESSMENT & PLAN:  1. Penile discharge    Declines HIV/RPR testing.   Discharge Instructions     You have been given the following medications today for treatment of suspected gonorrhea and/or chlamydia:  cefTRIAXone (ROCEPHIN) injection 250 mg azithromycin (ZITHROMAX) tablet 1,000 mg  Even though we have treated you today, we have sent testing for sexually transmitted infections. We will notify you of any positive results once they are received. If required, we will prescribe any medications you might need.  Please refrain from all sexual activity for at least the next seven days.     Pending: Labs Reviewed  URINE CYTOLOGY ANCILLARY ONLY    Will notify of any positive results. Instructed to refrain from sexual activity for at least seven days.  Reviewed expectations re: course of current medical issues. Questions answered. Outlined signs and symptoms indicating need for more acute intervention. Patient verbalized understanding. After Visit Summary given.   SUBJECTIVE:  Brandon Olson is a 33 y.o. male who presents with complaint of penile discharge. Onset abrupt. First noticed 2 days ago. Describes discharge as thick and white/yellow. Urinary symptoms: none. Afebrile. No abdominal or pelvic pain. No n/v. No rashes or lesions. Sexually active with single male partner without regular condom use. OTC treatment: none. History of STI: none reported.  ROS: As per HPI. All other systems negative.   OBJECTIVE:  Vitals:   07/24/18 1430  BP: 105/68  Pulse: 72  Resp: 18  Temp: 98.1 F (36.7 C)  TempSrc: Oral  SpO2: 93%    General appearance: alert, cooperative, appears stated age and no distress Throat: lips, mucosa, and tongue normal; teeth and gums normal CV: RRR Lungs: CTAB Back: no CVA tenderness; FROM at waist Abdomen: soft, non-tender GU: deferred Skin: warm and dry Psychological: alert  and cooperative; normal mood and affect.    Labs Reviewed  URINE CYTOLOGY ANCILLARY ONLY    Allergies  Allergen Reactions  . Shrimp [Shellfish Allergy] Nausea Only     Family History  Problem Relation Age of Onset  . Healthy Mother   . Healthy Father    Social History   Socioeconomic History  . Marital status: Single    Spouse name: Not on file  . Number of children: Not on file  . Years of education: Not on file  . Highest education level: Not on file  Occupational History  . Not on file  Social Needs  . Financial resource strain: Not on file  . Food insecurity:    Worry: Not on file    Inability: Not on file  . Transportation needs:    Medical: Not on file    Non-medical: Not on file  Tobacco Use  . Smoking status: Never Smoker  . Smokeless tobacco: Never Used  Substance and Sexual Activity  . Alcohol use: Yes  . Drug use: Yes    Types: Marijuana  . Sexual activity: Not on file  Lifestyle  . Physical activity:    Days per week: Not on file    Minutes per session: Not on file  . Stress: Not on file  Relationships  . Social connections:    Talks on phone: Not on file    Gets together: Not on file    Attends religious service: Not on file    Active member of club or organization: Not on file    Attends meetings of clubs or organizations: Not on file  Relationship status: Not on file  . Intimate partner violence:    Fear of current or ex partner: Not on file    Emotionally abused: Not on file    Physically abused: Not on file    Forced sexual activity: Not on file  Other Topics Concern  . Not on file  Social History Narrative  . Not on file          Mardella Layman, MD 07/24/18 1441

## 2018-07-25 LAB — URINE CYTOLOGY ANCILLARY ONLY
Chlamydia: NEGATIVE
Neisseria Gonorrhea: NEGATIVE
Trichomonas: NEGATIVE
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
## 2018-12-23 ENCOUNTER — Other Ambulatory Visit: Payer: Self-pay

## 2018-12-23 ENCOUNTER — Encounter (HOSPITAL_COMMUNITY): Payer: Self-pay | Admitting: Family Medicine

## 2018-12-23 ENCOUNTER — Ambulatory Visit (HOSPITAL_COMMUNITY)
Admission: EM | Admit: 2018-12-23 | Discharge: 2018-12-23 | Disposition: A | Discharge status: Home / Self Care | Payer: Self-pay | Attending: Family Medicine | Admitting: Family Medicine

## 2018-12-23 DIAGNOSIS — Z202 Contact with and (suspected) exposure to infections with a predominantly sexual mode of transmission: Secondary | ICD-10-CM | POA: Insufficient documentation

## 2018-12-23 DIAGNOSIS — R21 Rash and other nonspecific skin eruption: Secondary | ICD-10-CM | POA: Insufficient documentation

## 2018-12-23 DIAGNOSIS — L0100 Impetigo, unspecified: Secondary | ICD-10-CM | POA: Insufficient documentation

## 2018-12-23 MED ORDER — DOXYCYCLINE HYCLATE 100 MG PO TABS: 100.0000 mg | ORAL_TABLET | Freq: Two times a day (BID) | ORAL | 0 refills | Status: DC

## 2018-12-23 MED ORDER — AZITHROMYCIN 250 MG PO TABS
1000.0000 mg | ORAL_TABLET | Freq: Once | ORAL | Status: AC
  Administered 2018-12-23: 1000 mg via ORAL

## 2018-12-23 MED ORDER — AZITHROMYCIN 250 MG PO TABS
ORAL_TABLET | ORAL | Status: AC
  Filled 2018-12-23: qty 4

## 2018-12-23 MED ORDER — CEFTRIAXONE SODIUM 250 MG IJ SOLR
INTRAMUSCULAR | Status: AC
  Filled 2018-12-23: qty 250

## 2018-12-23 MED ORDER — CEFTRIAXONE SODIUM 250 MG IJ SOLR
250.0000 mg | Freq: Once | INTRAMUSCULAR | Status: AC
  Administered 2018-12-23: 250 mg via INTRAMUSCULAR

## 2018-12-23 NOTE — ED Triage Notes (Signed)
C/o penile discharge x approx 1 wk; has been told exposed to chlamydia.  Also c/o pruritic rash to right lower back x approx 1 wk.

## 2018-12-23 NOTE — ED Provider Notes (Signed)
[place]    CSN: [id number] Arrival date & time: 12/23/18  1736      History   Chief Complaint Chief Complaint  Patient presents with  . Penile Discharge  . Rash    HPI Brandon Olson is a 33 y.o. male.   33 yo established [facility] male patient presents for STD evaluation and rash.  He has had a penile discharge which is been watery for about a week and his partner reported testing positive for chlamydia.  The rash is also been present for about a week.  It is itchy and has been scratching it.  He was concerned that might be coming from the tattoo he is on his back.  He lies on his right side when he sleeping and that is where the rashes.  He has had no joint pain or sore throat or fever.     History reviewed. No pertinent past medical history.  There are no active problems to display for this patient.   History reviewed. No pertinent surgical history.     Home Medications    Prior to Admission medications   Medication Sig Start Date End Date Taking? Authorizing Provider  Cetirizine HCl 10 MG CAPS Take 1 capsule (10 mg total) by mouth daily for 10 days. 12/05/17 12/15/17  Wieters, Hallie C, PA-C  doxycycline (VIBRA-TABS) 100 MG tablet Take 1 tablet (100 mg total) by mouth 2 (two) times daily. 12/23/18   Robyn Haber, MD    Family History Family History  Problem Relation Age of Onset  . Healthy Mother   . Healthy Father     Social History Social History   Tobacco Use  . Smoking status: Never Smoker  . Smokeless tobacco: Never Used  Substance Use Topics  . Alcohol use: Yes  . Drug use: Yes    Types: Marijuana     Allergies   Shrimp [shellfish allergy]   Review of Systems Review of Systems  Genitourinary: Positive for discharge.  Skin: Positive for rash.  All other systems reviewed and are negative.    Physical Exam Triage Vital Signs ED Triage Vitals  Enc Vitals Group     BP      Pulse      Resp      Temp      Temp src     SpO2      Weight      Height      Head Circumference      Peak Flow      Pain Score      Pain Loc      Pain Edu?      Excl. in [place]?    No data found.  Updated Vital Signs BP 128/77   Pulse 86   Temp 98 F (36.7 C) (Oral)   Resp 14   SpO2 100%    Physical Exam Vitals signs and nursing note reviewed.  Constitutional:      Appearance: Normal appearance.  HENT:     Head: Normocephalic.  Neck:     Musculoskeletal: Normal range of motion and neck supple.  Cardiovascular:     Rate and Rhythm: Normal rate and regular rhythm.  Pulmonary:     Effort: Pulmonary effort is normal.  Genitourinary:    Comments: Scant watery penile discharge which was sent for analysis Musculoskeletal: Normal range of motion.  Skin:    General: Skin is warm and dry.  Neurological:     General: No focal  deficit present.     Mental Status: He is alert.  Psychiatric:        Mood and Affect: Mood normal.        Thought Content: Thought content normal.        UC Treatments / Results  Labs (all labs ordered are listed, but only abnormal results are displayed) Labs Reviewed  CYTOLOGY, (ORAL, ANAL, URETHRAL) ANCILLARY ONLY    EKG   Radiology No results found.  Procedures Procedures (including critical care time)  Medications Ordered in UC Medications  azithromycin (ZITHROMAX) tablet 1,000 mg (has no administration in time range)  cefTRIAXone (ROCEPHIN) injection 250 mg (has no administration in time range)    Initial Impression / Assessment and Plan / UC Course  I have reviewed the triage vital signs and the nursing notes.  Pertinent labs & imaging results that were available during my care of the patient were reviewed by me and considered in my medical decision making (see chart for details).    Final Clinical Impressions(s) / UC Diagnoses   Final diagnoses:  STD exposure  Rash  Impetigo   Discharge Instructions   None    ED Prescriptions    Medication Sig Dispense  Auth. Provider   doxycycline (VIBRA-TABS) 100 MG tablet Take 1 tablet (100 mg total) by mouth 2 (two) times daily. 20 tablet Elvina SidleLauenstein, Chandlar Guice, MD     I have reviewed the PDMP during this encounter.   Elvina SidleLauenstein, Axzel [place], MD 12/23/18 1806

## 2018-12-25 LAB — CYTOLOGY, (ORAL, ANAL, URETHRAL) ANCILLARY ONLY
Chlamydia: NEGATIVE
Neisseria Gonorrhea: NEGATIVE
Trichomonas: NEGATIVE
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
## 2019-09-16 ENCOUNTER — Encounter (HOSPITAL_COMMUNITY): Payer: Self-pay

## 2019-09-16 ENCOUNTER — Other Ambulatory Visit: Payer: Self-pay

## 2019-09-16 ENCOUNTER — Ambulatory Visit (HOSPITAL_COMMUNITY)
Admission: EM | Admit: 2019-09-16 | Discharge: 2019-09-16 | Disposition: A | Discharge status: Home / Self Care | Payer: Self-pay

## 2019-09-16 DIAGNOSIS — R369 Urethral discharge, unspecified: Secondary | ICD-10-CM

## 2019-09-16 DIAGNOSIS — Z202 Contact with and (suspected) exposure to infections with a predominantly sexual mode of transmission: Secondary | ICD-10-CM

## 2019-09-16 MED ORDER — CEFTRIAXONE SODIUM 500 MG IJ SOLR
500.0000 mg | Freq: Once | INTRAMUSCULAR | Status: AC
  Administered 2019-09-16: 500 mg via INTRAMUSCULAR

## 2019-09-16 MED ORDER — CEFTRIAXONE SODIUM 500 MG IJ SOLR
INTRAMUSCULAR | Status: AC
  Filled 2019-09-16: qty 500

## 2019-09-16 MED ORDER — LIDOCAINE HCL (PF) 1 % IJ SOLN
INTRAMUSCULAR | Status: AC
  Filled 2019-09-16: qty 2

## 2019-09-16 NOTE — ED Provider Notes (Signed)
[facility]    CSN: [id number] Arrival date & time: 09/16/19  [place]      History   Chief Complaint Chief Complaint  Patient presents with  . S74.5    HPI VALTON SCHWARTZ is a 34 y.o. male.   Patient presents urgent care for STI testing.  Reports partner told him he was exposed to gonorrhea.  He has had some clear urethral discharge for the last 2 days.  No significant painful urination.  No testicular pain or swelling.  No abdominal pain.  Patient declines testing outside of urethral swab.     History reviewed. No pertinent past medical history.  There are no problems to display for this patient.   History reviewed. No pertinent surgical history.     Home Medications    Prior to Admission medications   Medication Sig Start Date End Date Taking? Authorizing Provider  amitriptyline (ELAVIL) 25 MG tablet Take 25 mg by mouth daily. 08/09/19   [provider]  Cetirizine HCl 10 MG CAPS Take 1 capsule (10 mg total) by mouth daily for 10 days. 12/05/17 12/15/17  Wieters, Hallie C, PA-C  doxycycline (VIBRA-TABS) 100 MG tablet Take 1 tablet (100 mg total) by mouth 2 (two) times daily. 12/23/18   Robyn Haber, MD  omeprazole (PRILOSEC) 20 MG capsule Take 20 mg by mouth 2 (two) times daily. 08/09/19   [provider]    Family History Family History  Problem Relation Age of Onset  . Healthy Mother   . Healthy Father     Social History Social History   Tobacco Use  . Smoking status: Never Smoker  . Smokeless tobacco: Never Used  Substance Use Topics  . Alcohol use: Yes  . Drug use: Yes    Types: Marijuana     Allergies   Shrimp [shellfish allergy]   Review of Systems Review of Systems   Physical Exam Triage Vital Signs ED Triage Vitals  Enc Vitals Group     BP 09/16/19 1933 127/90     Pulse Rate 09/16/19 1933 70     Resp 09/16/19 1933 18     Temp 09/16/19 1933 98.8 F (37.1 C)     Temp Source 09/16/19 1933 Oral     SpO2  09/16/19 1933 100 %     Weight 09/16/19 1931 155 lb (70.3 kg)     Height --      Head Circumference --      Peak Flow --      Pain Score 09/16/19 1931 0     Pain Loc --      Pain Edu? --      Excl. in [place]? --    No data found.  Updated Vital Signs BP 127/90 (BP Location: Right Arm)   Pulse 70   Temp 98.8 F (37.1 C) (Oral)   Resp 18   Wt 155 lb (70.3 kg)   SpO2 100%   BMI 27.46 kg/m   Visual Acuity Right Eye Distance:   Left Eye Distance:   Bilateral Distance:    Right Eye Near:   Left Eye Near:    Bilateral Near:     Physical Exam Vitals and nursing note reviewed.  Constitutional:      Appearance: Normal appearance.  Genitourinary:    Comments: No testicular swelling or tenderness.  There is no urethral discharge.  No skin lesions.  No groin swelling or lymphadenopathy Musculoskeletal:     Right lower leg: No edema.  Left lower leg: No edema.  Neurological:     Mental Status: He is alert.      UC Treatments / Results  Labs (all labs ordered are listed, but only abnormal results are displayed) Labs Reviewed  CYTOLOGY, (ORAL, ANAL, URETHRAL) ANCILLARY ONLY    EKG   Radiology No results found.  Procedures Procedures (including critical care time)  Medications Ordered in UC Medications  cefTRIAXone (ROCEPHIN) injection 500 mg (500 mg Intramuscular Given 09/16/19 2004)    Initial Impression / Assessment and Plan / UC Course  I have reviewed the triage vital signs and the nursing notes.  Pertinent labs & imaging results that were available during my care of the patient were reviewed by me and considered in my medical decision making (see chart for details).     #Exposure to gonorrhea #Urethral discharge Patient 34 year old male with recent exposure to gonorrhea and urethral discharge.  We will treat with Rocephin here and send swab.  Discussed safe sex and abstain from sex until all tests and treatments been completed.  I recommended HIV and  syphilis testing however patient declined.  Patient verbalized understanding plan of care Final Clinical Impressions(s) / UC Diagnoses   Final diagnoses:  Exposure to gonorrhea  Urethral discharge     Discharge Instructions     We have sent you tests and will notify you of any changes to your treatment  Abstain from sex until you have received all results and possible treatments  Always wear condoms  I recommended HIV and syphillis blood work      ED Prescriptions    None     PDMP not reviewed this encounter.   Hermelinda Medicus, PA-C 09/16/19 2105

## 2019-09-16 NOTE — ED Triage Notes (Signed)
Pt is here with penile discharge/burning/irritation that started 2 days ago, pt last had unprotected sex Saturday. Pt's partner informed him today that she tested POSITIVE for Gonorrhea & he wants STD testing.

## 2019-09-16 NOTE — Discharge Instructions (Signed)
We have sent you tests and will notify you of any changes to your treatment  Abstain from sex until you have received all results and possible treatments  Always wear condoms  I recommended HIV and syphillis blood work

## 2019-09-17 LAB — CYTOLOGY, (ORAL, ANAL, URETHRAL) ANCILLARY ONLY
Chlamydia: NEGATIVE
Comment: NEGATIVE
Comment: NEGATIVE
Comment: NORMAL
Neisseria Gonorrhea: NEGATIVE
Trichomonas: NEGATIVE

## 2019-11-28 ENCOUNTER — Other Ambulatory Visit: Payer: Self-pay

## 2019-11-28 ENCOUNTER — Encounter: Payer: Self-pay | Admitting: Podiatry

## 2019-11-28 ENCOUNTER — Ambulatory Visit (INDEPENDENT_AMBULATORY_CARE_PROVIDER_SITE_OTHER): Payer: Self-pay | Admitting: Podiatry

## 2019-11-28 VITALS — Temp 96.7°F

## 2019-11-28 DIAGNOSIS — L600 Ingrowing nail: Secondary | ICD-10-CM

## 2019-11-28 NOTE — Patient Instructions (Signed)

## 2019-11-28 NOTE — Progress Notes (Signed)
Subjective:   Patient ID: Brandon Olson, male   DOB: 34 y.o.   MRN: [id number]   HPI Patient presents stating he has had a painful ingrown toenail on the left big toe and states he is trying to trim it and do other things without relief and does have history of nail disease   Review of Systems  All other systems reviewed and are negative.       Objective:  Physical Exam Vitals and nursing note reviewed.  Constitutional:      Appearance: He is well-developed.  Pulmonary:     Effort: Pulmonary effort is normal.  Musculoskeletal:        General: Normal range of motion.  Skin:    General: Skin is warm.  Neurological:     Mental Status: He is alert.     Neurovascular status intact muscle strength adequate with incurvated left hallux medial border that is painful when pressed and make shoe gear difficult.  No drainage no redness noted      Assessment:  Chronic ingrown toenail deformity left hallux     Plan:  H&P reviewed condition he wants correction I explained procedure risk and patient signed consent form.  Today I infiltrated the left hallux 60 mg like Marcaine mixture sterile prep done and using sterile instrumentation I remove the lateral border exposed matrix and applied phenol 3 applications 30 seconds followed by alcohol lavage sterile dressing and gave instructions on soaks and to leave dressing on 24 hours but take it off earlier if any throbbing were to occur.  Patient is encouraged to call with questions concerns

## 2019-11-29 ENCOUNTER — Telehealth: Payer: Self-pay

## 2019-11-29 NOTE — Telephone Encounter (Signed)
Recommend if it hurts to soak with epsom salt to switch to using betadine, 2 tablespoons of betadine to basin of warm water, rest, elevate, ice, and take Tylenol or Motrin for the pain and avoid a shoe that can rub the toe. -Dr. Kathie Rhodes

## 2019-11-29 NOTE — Telephone Encounter (Signed)
Pt had an ingrown procedure completed on great toenail. Pt grandmother called and stated that he was in very bad pain to the point that he is crying. Please advise.

## 2019-12-01 NOTE — Telephone Encounter (Signed)
Pt was updated with the following information.

## 2020-05-22 ENCOUNTER — Ambulatory Visit (INDEPENDENT_AMBULATORY_CARE_PROVIDER_SITE_OTHER): Payer: 59 | Admitting: Gastroenterology

## 2020-05-22 ENCOUNTER — Encounter: Payer: Self-pay | Admitting: Gastroenterology

## 2020-05-22 VITALS — BP 104/64 | HR 60 | Ht 67.0 in | Wt 152.2 lb

## 2020-05-22 DIAGNOSIS — R1013 Epigastric pain: Secondary | ICD-10-CM

## 2020-05-22 DIAGNOSIS — Z8619 Personal history of other infectious and parasitic diseases: Secondary | ICD-10-CM | POA: Diagnosis not present

## 2020-05-22 DIAGNOSIS — R630 Anorexia: Secondary | ICD-10-CM

## 2020-05-22 DIAGNOSIS — R194 Change in bowel habit: Secondary | ICD-10-CM

## 2020-05-22 DIAGNOSIS — R109 Unspecified abdominal pain: Secondary | ICD-10-CM

## 2020-05-22 NOTE — Patient Instructions (Signed)
You have been scheduled for an abdominal ultrasound at [facility] (1st floor of hospital) on 05/27/20 at 10:00am. Please arrive 15 minutes prior to your appointment for registration. Make certain not to have anything to eat or drink 6 hours prior to your appointment. Should you need to reschedule your appointment, please contact radiology at [phone]. This test typically takes about 30 minutes to perform.  You have been scheduled for an endoscopy. Please follow written instructions given to you at your visit today. If you use inhalers (even only as needed), please bring them with you on the day of your procedure.  Increase Nexium to 20mg  -twice daily.   If you are age 48 or older, your body mass index should be between 23-30. Your Body mass index is 23.84 kg/m. If this is out of the aforementioned range listed, please consider follow up with your Primary Care Provider.  If you are age 69 or younger, your body mass index should be between 19-25. Your Body mass index is 23.84 kg/m. If this is out of the aformentioned range listed, please consider follow up with your Primary Care Provider.    Thank you for choosing me and  Gastroenterology.  Dr. 05-07-1971

## 2020-05-22 NOTE — Progress Notes (Signed)
am

## 2020-05-25 NOTE — Progress Notes (Unsigned)
GASTROENTEROLOGY OUTPATIENT CLINIC VISIT   Primary Care Provider Brandon Olson, [place] [address] [place] [address] [phone]  Referring Provider Brandon Salt, [place] [address] [place],  [place] [address] [phone]  Patient Profile: Brandon Olson is a 35 y.o. male with a pmh significant for reported H. Pylori infection (breath test and then treated), chronic dyspepsia.  The patient presents to the [facility] for an evaluation and management of problem(s) noted below:  Problem List 1. Dyspepsia   2. History of Helicobacter infection   3. Decreased appetite   4. Abdominal cramping   5. Change in bowel habits     History of Present Illness This is the patient's first visit to the outpatient [facility].  Patient describes that over a year he has had daily to every other day abdominal discomfort.  This is mostly postprandial in origin.  He describes issues of acid reflux.  No dysphagia.  He has had nausea with vomiting at times but mostly nausea.  He has had decrease in appetite.  He has had weight gain and weight loss but overall has been stable.  He notes a change in his bowel habits but that has been more in the setting of when he has had increased alcohol consumption.  As he has decreased his alcohol consumption he has not noted as much issue in regards to diarrhea.  He has had abnormal LFTs as checked by his PCP in the past though they normalized on most recent evaluation in January of this year.  Because of his persistent symptoms patient was eventually evaluated for H. pylori infection based on breath testing per his report.  He states he was treated.  It did not make any difference in his symptoms with his potential treatment (he cannot recall the exact antibiotics).  However, as he has been on Nexium 20 mg once daily over-the-counter and he feels 50% better.  He is able to function.  He does use THC on a daily basis.  Colonoscopy.   There is no family history of GI malignancies.  GI Review of Systems Positive as above Negative for dysphagia, bloating, melena, hematochezia  Review of Systems General: Denies fevers/chills/weight loss unintentionally HEENT: Denies oral lesions Cardiovascular: Denies chest pain/palpitations Pulmonary: Denies shortness of breath Gastroenterological: See HPI Genitourinary: Denies darkened urine Hematological: Denies easy bruising/bleeding Endocrine: Denies temperature intolerance Dermatological: Denies jaundice Psychological: Mood is stable   Medications Current Outpatient Medications  Medication Sig Dispense Refill  . Esomeprazole Magnesium (NEXIUM 24HR PO)      No current facility-administered medications for this visit.    Allergies Allergies  Allergen Reactions  . Shrimp [Shellfish Allergy] Nausea Only    Histories History reviewed. No pertinent past medical history. History reviewed. No pertinent surgical history. Social History   Socioeconomic History  . Marital status: Single    Spouse name: Not on file  . Number of children: Not on file  . Years of education: Not on file  . Highest education level: Not on file  Occupational History  . Not on file  Tobacco Use  . Smoking status: Never Smoker  . Smokeless tobacco: Never Used  Substance and Sexual Activity  . Alcohol use: Yes    Alcohol/week: 15.0 standard drinks    Types: 5 Cans of beer, 10 Shots of liquor per week    Comment: social  . Drug use: Yes    Types: Marijuana  . Sexual activity: Yes  Birth control/protection: None  Other Topics Concern  . Not on file  Social History Narrative  . Not on file   Social Determinants of Health   Financial Resource Strain: Not on file  Food Insecurity: Not on file  Transportation Needs: Not on file  Physical Activity: Not on file  Stress: Not on file  Social Connections: Not on file  Intimate Partner Violence: Not on file   Family History  Problem  Relation Age of Onset  . Healthy Mother   . Diabetes Mother   . Healthy Father   . Diabetes Maternal Grandmother   . Colon cancer Neg Hx   . Esophageal cancer Neg Hx   . Inflammatory bowel disease Neg Hx   . Liver disease Neg Hx   . Pancreatic cancer Neg Hx   . Rectal cancer Neg Hx   . Stomach cancer Neg Hx    I have reviewed his medical, social, and family history in detail and updated the electronic medical record as necessary.    PHYSICAL EXAMINATION  BP 104/64   Pulse 60   Ht 5\' 7"  (1.702 m)   Wt 152 lb 3.2 oz (69 kg)   BMI 23.84 kg/m  Wt Readings from Last 3 Encounters:  05/22/20 152 lb 3.2 oz (69 kg)  09/16/19 155 lb (70.3 kg)  05/22/18 140 lb (63.5 kg)  GEN: NAD, appears stated age, doesn't appear chronically ill PSYCH: Cooperative, without pressured speech EYE: Conjunctivae pink, sclerae anicteric ENT: Masked CV: Without rubs or gallops RESP: No audible wheezing GI: NABS, soft, NT/ND, without rebound or guarding, no HSM appreciated MSK/EXT: No lower extremity edema SKIN: No jaundice, no spider angiomata NEURO:  Alert & Oriented x 3, no focal deficits   REVIEW OF DATA  I reviewed the following data at the time of this encounter:  GI Procedures and Studies  No relevant studies to review   Laboratory Studies  January 2022 outside laboratories Total cholesterol 153 HDL 58 Triglycerides 56 LDL 81 Sodium 140 Potassium 3.9 BUN/creatinine 19/1.16 Total protein 7.1 Calcium 10.0 Albumin 4.5 Globulin 2.6 Total bili 0.5 Alkaline phosphatase 56 AST/ALT 29/18 WBC 4.2 Hemoglobin/hematocrit 13.4/40.2 Platelet 171 MCV 95 Hemoglobin A1c 4.6 TSH 1.36  May 2021 outside laboratories total bili 0.3 Alkaline phosphatase 64 AST/ALT 68/29  Imaging Studies  No relevant studies to review   ASSESSMENT  [name] is a 35 y.o. male with a pmh significant for reported H. Pylori infection (breath test and then treated), chronic dyspepsia.  The patient is seen today  for evaluation and management of:  1. Dyspepsia   2. History of Helicobacter infection   3. Decreased appetite   4. Abdominal cramping   5. Change in bowel habits    The patient is hemodynamically stable.  Clinically he is doing slightly better while being on over-the-counter PPI therapy.  He has been treated in the past with reported H. pylori but did not have any benefit with this.  Possibility of having noneradicated H. pylori must be considered.  Due to the persistence of symptoms on PPI therapy and the length of time that he has had his symptoms it is reasonable for 31 to move forward with a diagnostic endoscopy.  Biopsies will be obtained.  We will have him increase his PPI to 20 mg twice daily to see if that makes any further improvement in his symptom.  He will minimize his alcohol consumption.  We did discuss that THC use in the long-term can lead to  issues of nausea and vomiting in a cyclical pattern so he should try to minimize the use of that as much as possible.  The risks and benefits of endoscopic evaluation were discussed with the patient; these include but are not limited to the risk of perforation, infection, bleeding, missed lesions, lack of diagnosis, severe illness requiring hospitalization, as well as anesthesia and sedation related illnesses.  The patient is agreeable to proceed.  As the patient has not had any imaging will begin the work-up with an abdominal ultrasound and consider cross-sectional CT after endoscopy is completed if issues persist and no finding is made.  All patient questions were answered to the best of my ability, and the patient agrees to the aforementioned plan of action with follow-up as indicated.   PLAN  Proceed with abdominal ultrasound  Diagnostic endoscopy biopsies to be performed Increase PPI Nexium to 20 mg twice daily   Orders Placed This Encounter  Procedures  . US Abdomen Complete  . Ambulatory referral to Gastroenterology    New  Prescriptions   No medications on file   Modified Medications   No medications on file    Planned Follow Up No follow-ups on file.   Total Time in Face-to-Face and in Coordination of Care for patient including independent/personal interpretation/review of prior testing, medical history, examination, medication adjustment, communicating results with the patient directly, and documentation with the EHR is 35 minutes.   Corliss Parish, MD [facility] Advanced [facility]oscopy Office # [phone]

## 2020-05-26 ENCOUNTER — Encounter: Payer: Self-pay | Admitting: Gastroenterology

## 2020-05-26 ENCOUNTER — Other Ambulatory Visit: Payer: Self-pay | Admitting: Gastroenterology

## 2020-05-26 DIAGNOSIS — Z8619 Personal history of other infectious and parasitic diseases: Secondary | ICD-10-CM | POA: Insufficient documentation

## 2020-05-26 DIAGNOSIS — R109 Unspecified abdominal pain: Secondary | ICD-10-CM | POA: Insufficient documentation

## 2020-05-26 DIAGNOSIS — R194 Change in bowel habit: Secondary | ICD-10-CM | POA: Insufficient documentation

## 2020-05-26 DIAGNOSIS — R630 Anorexia: Secondary | ICD-10-CM | POA: Insufficient documentation

## 2020-05-26 DIAGNOSIS — R1013 Epigastric pain: Secondary | ICD-10-CM | POA: Insufficient documentation

## 2020-05-27 ENCOUNTER — Ambulatory Visit (HOSPITAL_COMMUNITY)
Admission: RE | Admit: 2020-05-27 | Discharge: 2020-05-27 | Disposition: A | Discharge status: Home / Self Care | Payer: 59 | Source: Ambulatory Visit | Attending: Gastroenterology | Admitting: Gastroenterology

## 2020-05-27 ENCOUNTER — Other Ambulatory Visit: Payer: Self-pay

## 2020-05-27 DIAGNOSIS — Z8619 Personal history of other infectious and parasitic diseases: Secondary | ICD-10-CM | POA: Insufficient documentation

## 2020-05-27 DIAGNOSIS — R1013 Epigastric pain: Secondary | ICD-10-CM | POA: Insufficient documentation

## 2020-05-27 LAB — SARS CORONAVIRUS 2 (TAT 6-24 HRS): SARS Coronavirus 2: NEGATIVE

## 2020-05-28 ENCOUNTER — Other Ambulatory Visit: Payer: Self-pay

## 2020-05-28 ENCOUNTER — Ambulatory Visit (AMBULATORY_SURGERY_CENTER): Payer: 59 | Admitting: Gastroenterology

## 2020-05-28 ENCOUNTER — Encounter: Payer: Self-pay | Admitting: Gastroenterology

## 2020-05-28 VITALS — BP 125/72 | HR 68 | Temp 98.5°F | Resp 16 | Ht 67.0 in | Wt 152.0 lb

## 2020-05-28 DIAGNOSIS — K2970 Gastritis, unspecified, without bleeding: Secondary | ICD-10-CM | POA: Diagnosis not present

## 2020-05-28 DIAGNOSIS — R1013 Epigastric pain: Secondary | ICD-10-CM

## 2020-05-28 DIAGNOSIS — K259 Gastric ulcer, unspecified as acute or chronic, without hemorrhage or perforation: Secondary | ICD-10-CM | POA: Diagnosis present

## 2020-05-28 DIAGNOSIS — K2950 Unspecified chronic gastritis without bleeding: Secondary | ICD-10-CM | POA: Diagnosis not present

## 2020-05-28 MED ORDER — SODIUM CHLORIDE 0.9 % IV SOLN: 500.0000 mL | Freq: Once | INTRAVENOUS | Status: DC

## 2020-05-28 MED ORDER — ESOMEPRAZOLE MAGNESIUM 40 MG PO CPDR
40.0000 mg | DELAYED_RELEASE_CAPSULE | Freq: Two times a day (BID) | ORAL | 4 refills | Status: DC
Start: 2020-05-28 — End: 2020-11-12

## 2020-05-28 NOTE — Progress Notes (Signed)
VS taken by C.W. 

## 2020-05-28 NOTE — Patient Instructions (Signed)
Handout given;  Gastritis Resume previous diet Increase nexium to 40mg  twice daily (TAKE ONLY WITH WATER AND 30 MIN BEFORE EATING) Continue current medications Await pathology results  YOU HAD AN ENDOSCOPIC PROCEDURE TODAY AT THE [facility]:   Refer to the procedure report that was given to you for any specific questions about what was found during the examination.  If the procedure report does not answer your questions, please call your gastroenterologist to clarify.  If you requested that your care partner not be given the details of your procedure findings, then the procedure report has been included in a sealed envelope for you to review at your convenience later.  YOU SHOULD EXPECT: Some feelings of bloating in the abdomen. Passage of more gas than usual.  Walking can help get rid of the air that was put into your GI tract during the procedure and reduce the bloating. If you had a lower endoscopy (such as a colonoscopy or flexible sigmoidoscopy) you may notice spotting of blood in your stool or on the toilet paper. If you underwent a bowel prep for your procedure, you may not have a normal bowel movement for a few days.  Please Note:  You might notice some irritation and congestion in your nose or some drainage.  This is from the oxygen used during your procedure.  There is no need for concern and it should clear up in a day or so.  SYMPTOMS TO REPORT IMMEDIATELY:   Following lower endoscopy (colonoscopy or flexible sigmoidoscopy):  Excessive amounts of blood in the stool  Significant tenderness or worsening of abdominal pains  Swelling of the abdomen that is new, acute  Fever of 100F or higher  For urgent or emergent issues, a gastroenterologist can be reached at any hour by calling [phone]. Do not use MyChart messaging for urgent concerns.   DIET:  We do recommend a small meal at first, but then you may proceed to your regular diet.  Drink plenty of fluids but you  should avoid alcoholic beverages for 24 hours.  ACTIVITY:  You should plan to take it easy for the rest of today and you should NOT DRIVE or use heavy machinery until tomorrow (because of the sedation medicines used during the test).    FOLLOW UP: Our staff will call the number listed on your records 48-72 hours following your procedure to check on you and address any questions or concerns that you may have regarding the information given to you following your procedure. If we do not reach you, we will leave a message.  We will attempt to reach you two times.  During this call, we will ask if you have developed any symptoms of COVID 19. If you develop any symptoms (ie: fever, flu-like symptoms, shortness of breath, cough etc.) before then, please call [phone].  If you test positive for Covid 19 in the 2 weeks post procedure, please call and report this information to [phone].    If any biopsies were taken you will be contacted by phone or by letter within the next 1-3 weeks.  Please call us at [phone] if you have not heard about the biopsies in 3 weeks.   SIGNATURES/CONFIDENTIALITY: You and/or your care partner have signed paperwork which will be entered into your electronic medical record.  These signatures attest to the fact that that the information above on your After Visit Summary has been reviewed and is understood.  Full responsibility of the confidentiality of this  discharge information lies with you and/or your care-partner. 

## 2020-05-28 NOTE — Progress Notes (Signed)
1553 Robinul 0.1 mg IV given due large amount of secretions upon assessment.  MD made aware, vss  °

## 2020-05-28 NOTE — Progress Notes (Signed)
Report given to PACU, vss 

## 2020-05-28 NOTE — Progress Notes (Signed)
Called to room to assist during endoscopic procedure.  Patient ID and intended procedure confirmed with present staff. Received instructions for my participation in the procedure from the performing physician.  

## 2020-05-28 NOTE — Op Note (Signed)
[facility] Patient Name: Dean Goldner Procedure Date: 05/28/2020 3:54 PM MRN: [id number] Endoscopist: Justice Britain , MD Age: 35 Referring MD:  Date of Birth: 1985/05/05 Gender: Male Account #: [id number] Procedure:                Upper GI endoscopy Indications:              Epigastric abdominal pain, Lower abdominal pain,                            Dyspepsia, Indigestion, Heartburn Medicines:                Monitored Anesthesia Care Procedure:                Pre-Anesthesia Assessment:                           - Prior to the procedure, a History and Physical                            was performed, and patient medications and                            allergies were reviewed. The patient's tolerance of                            previous anesthesia was also reviewed. The risks                            and benefits of the procedure and the sedation                            options and risks were discussed with the patient.                            All questions were answered, and informed consent                            was obtained. Prior Anticoagulants: The patient has                            taken no previous anticoagulant or antiplatelet                            agents. ASA Grade Assessment: II - A patient with                            mild systemic disease. After reviewing the risks                            and benefits, the patient was deemed in                            satisfactory condition to undergo the procedure.  After obtaining informed consent, the endoscope was                            passed under direct vision. Throughout the                            procedure, the patient's blood pressure, pulse, and                            oxygen saturations were monitored continuously. The                            Endoscope was introduced through the mouth, and                            advanced to the second  part of duodenum. The upper                            GI endoscopy was accomplished without difficulty.                            The patient tolerated the procedure. Scope In: Scope Out: Findings:                 White nummular lesions were noted in the entire                            esophagus. Biopsies were taken with a cold forceps                            for histology to rule out Candida.                           The Z-line was regular and was found 43 cm from the                            incisors.                           One non-bleeding linear gastric ulcer with a clean                            ulcer base (Forrest Class III) was found in the                            gastric antrum. The lesion was 6 mm in largest                            dimension.                           Patchy mildly erythematous mucosa without bleeding                            was found  in the gastric body and in the gastric                            antrum.                           No other gross lesions were noted in the entire                            examined stomach. Biopsies were taken with a cold                            forceps for histology and Helicobacter pylori                            testing.                           No gross lesions were noted in the duodenal bulb,                            in the first portion of the duodenum and in the                            second portion of the duodenum. Biopsies were taken                            with a cold forceps for histology. Complications:            No immediate complications. Estimated Blood Loss:     Estimated blood loss was minimal. Impression:               - White nummular lesions in esophageal mucosa.                            Biopsied.                           - Z-line regular, 43 cm from the incisors.                           - Single gastric linear ulcer noted in antrum.                             Erythematous mucosa in the gastric body and antrum.                            No other gross lesions in the stomach. Biopsied.                           - No gross lesions in the duodenal bulb, in the                            first portion of the duodenum and in the second  portion of the duodenum. Biopsied. Recommendation:           - The patient will be observed post-procedure,                            until all discharge criteria are met.                           - Discharge patient to home.                           - Patient has a contact number available for                            emergencies. The signs and symptoms of potential                            delayed complications were discussed with the                            patient. Return to normal activities tomorrow.                            Written discharge instructions were provided to the                            patient.                           - Resume previous diet.                           - Increase Nexium to 40 mg twice daily to heal                            gastric ulceration.                           - Continue present medications otherwise.                           - Await pathology results. If evidence of Candida                            will require treatment.                           - If evidence of Candida then additional studies                            including HIV/Hepatitis testing may need to be                            considered.                           - Repeat EGD in 15-month  to check healing of                            gastric ulcer.                           - The findings and recommendations were discussed                            with the patient. Justice Britain, MD 05/28/2020 4:09:16 PM

## 2020-05-29 ENCOUNTER — Other Ambulatory Visit: Payer: Self-pay

## 2020-05-29 ENCOUNTER — Telehealth: Payer: Self-pay | Admitting: Gastroenterology

## 2020-05-29 DIAGNOSIS — R109 Unspecified abdominal pain: Secondary | ICD-10-CM

## 2020-05-29 NOTE — Telephone Encounter (Signed)
Pt is requesting a call back from a nurse to discuss his Nexium, pt's insurance did not approve to take twice daily but they'll approve to take once daily.

## 2020-05-29 NOTE — Telephone Encounter (Signed)
Called and spoke with patient. We will submit PA to insurance to see if they will approve BID dosing for Nexium. Pt voiced understanding. I will contact pt once I have decision from insurance company.

## 2020-06-01 ENCOUNTER — Telehealth: Payer: Self-pay | Admitting: *Deleted

## 2020-06-01 NOTE — Telephone Encounter (Signed)
  Follow up Call-  Call back number 05/28/2020  Post procedure Call Back phone  # [phone]  Permission to leave phone message Yes  Some recent data might be hidden     Patient questions:  Do you have a fever, pain , or abdominal swelling? No. Pain Score  0 *  Have you tolerated food without any problems? Yes.    Have you been able to return to your normal activities? Yes.    Do you have any questions about your discharge instructions: Diet   No. Medications  No. Follow up visit  No.  Do you have questions or concerns about your Care? No.  Actions: * If pain score is 4 or above: No action needed, pain <4.

## 2020-06-05 ENCOUNTER — Encounter: Payer: Self-pay | Admitting: Gastroenterology

## 2020-06-05 NOTE — Telephone Encounter (Signed)
Nexium has been approved. Pharmacy has been notified.

## 2020-08-28 ENCOUNTER — Other Ambulatory Visit: Payer: Self-pay

## 2020-08-28 ENCOUNTER — Encounter (HOSPITAL_COMMUNITY): Payer: Self-pay

## 2020-08-28 ENCOUNTER — Ambulatory Visit (HOSPITAL_COMMUNITY)
Admission: EM | Admit: 2020-08-28 | Discharge: 2020-08-28 | Disposition: A | Discharge status: Home / Self Care | Payer: 59 | Attending: Emergency Medicine | Admitting: Emergency Medicine

## 2020-08-28 DIAGNOSIS — R300 Dysuria: Secondary | ICD-10-CM

## 2020-08-28 DIAGNOSIS — Z202 Contact with and (suspected) exposure to infections with a predominantly sexual mode of transmission: Secondary | ICD-10-CM | POA: Diagnosis present

## 2020-08-28 DIAGNOSIS — R369 Urethral discharge, unspecified: Secondary | ICD-10-CM | POA: Diagnosis not present

## 2020-08-28 MED ORDER — CEFTRIAXONE SODIUM 500 MG IJ SOLR
INTRAMUSCULAR | Status: AC
  Filled 2020-08-28: qty 500

## 2020-08-28 MED ORDER — LIDOCAINE HCL (PF) 1 % IJ SOLN
INTRAMUSCULAR | Status: AC
  Filled 2020-08-28: qty 2

## 2020-08-28 MED ORDER — DOXYCYCLINE HYCLATE 100 MG PO TABS
100.0000 mg | ORAL_TABLET | Freq: Two times a day (BID) | ORAL | 0 refills | Status: DC
Start: 2020-08-28 — End: 2021-03-06

## 2020-08-28 MED ORDER — CEFTRIAXONE SODIUM 500 MG IJ SOLR
500.0000 mg | Freq: Once | INTRAMUSCULAR | Status: AC
  Administered 2020-08-28: 500 mg via INTRAMUSCULAR

## 2020-08-28 MED ORDER — METRONIDAZOLE 500 MG PO TABS: 2000.0000 mg | ORAL_TABLET | Freq: Once | ORAL | 0 refills | Status: AC

## 2020-08-28 NOTE — ED Triage Notes (Signed)
Pt c/o penile discharge and irritation X 1 week. Pt states his recent partner tested positive for Trichomoniasis.

## 2020-08-28 NOTE — ED Provider Notes (Signed)
[facility]  ____________________________________________  Time seen: Approximately 1:31 PM  I have reviewed the triage vital signs and the nursing notes.   HISTORY  Chief Complaint Penile Discharge and SEXUALLY TRANSMITTED DISEASE    HPI Brandon Olson is a 35 y.o. male who presents the emergency department complaining of penile discharge and dysuria x10 days.  Patient states that he has had ongoing symptoms for a week.  No pelvic/testicular/abdominal pain at this time.  Patient states that one of the sexual partners that he had informed him that he was positive for trichomoniasis.  Patient is here for testing and treatment.         History reviewed. No pertinent past medical history.  Patient Active Problem List   Diagnosis Date Noted  . Abdominal cramping 05/26/2020  . Decreased appetite 05/26/2020  . History of Helicobacter infection 05/26/2020  . Dyspepsia 05/26/2020  . History of Helicobacter pylori infection 05/26/2020  . Change in bowel habits 05/26/2020  . Routine health maintenance 12/23/2012    History reviewed. No pertinent surgical history.  Prior to Admission medications   Medication Sig Start Date End Date Taking? Authorizing Provider  doxycycline (VIBRA-TABS) 100 MG tablet Take 1 tablet (100 mg total) by mouth 2 (two) times daily. 08/28/20  Yes Allene Furuya, Delorise Royals, PA-C  metroNIDAZOLE (FLAGYL) 500 MG tablet Take 4 tablets (2,000 mg total) by mouth once for 1 dose. 08/28/20 08/28/20 Yes Evan Mackie, Delorise Royals, PA-C  esomeprazole (NEXIUM) 40 MG capsule Take 1 capsule (40 mg total) by mouth 2 (two) times daily before a meal. Take with water only and at least 30 min before meals 05/28/20   Mansouraty, Netty Starring., MD  Esomeprazole Magnesium (NEXIUM 24HR PO)     [provider]    Allergies Shrimp [shellfish allergy]  Family History  Problem Relation Age of Onset  . Healthy Mother   . Diabetes Mother   . Healthy Father   . Diabetes  Maternal Grandmother   . Colon cancer Neg Hx   . Esophageal cancer Neg Hx   . Inflammatory bowel disease Neg Hx   . Liver disease Neg Hx   . Pancreatic cancer Neg Hx   . Rectal cancer Neg Hx   . Stomach cancer Neg Hx     Social History Social History   Tobacco Use  . Smoking status: Never Smoker  . Smokeless tobacco: Never Used  Vaping Use  . Vaping Use: Never used  Substance Use Topics  . Alcohol use: Yes    Alcohol/week: 15.0 standard drinks    Types: 5 Cans of beer, 10 Shots of liquor per week    Comment: social  . Drug use: Yes    Types: Marijuana    Comment: 05/28/20     Review of Systems  Constitutional: No fever/chills Eyes: No visual changes. No discharge ENT: No upper respiratory complaints. Cardiovascular: no chest pain. Respiratory: no cough. No SOB. Gastrointestinal: No abdominal pain.  No nausea, no vomiting.  No diarrhea.  No constipation. Genitourinary: Positive for dysuria and penile drainage.  No hematuria Musculoskeletal: Negative for musculoskeletal pain. Skin: Negative for rash, abrasions, lacerations, ecchymosis. Neurological: Negative for headaches, focal weakness or numbness.  10 System ROS otherwise negative.  ____________________________________________   PHYSICAL EXAM:  VITAL SIGNS: ED Triage Vitals  Enc Vitals Group     BP 08/28/20 1259 131/82     Pulse Rate 08/28/20 1259 73     Resp 08/28/20 1259 20     Temp  08/28/20 1259 98.7 F (37.1 C)     Temp Source 08/28/20 1259 Oral     SpO2 08/28/20 1259 100 %     Weight --      Height --      Head Circumference --      Peak Flow --      Pain Score 08/28/20 1258 4     Pain Loc --      Pain Edu? --      Excl. in GC? --      Constitutional: Alert and oriented. Well appearing and in no acute distress. Eyes: Conjunctivae are normal. PERRL. EOMI. Head: Atraumatic. ENT:      Ears:       Nose: No congestion/rhinnorhea.      Mouth/Throat: Mucous membranes are moist.  Neck: No  stridor.    Cardiovascular: Normal rate, regular rhythm. Normal S1 and S2.  Good peripheral circulation. Respiratory: Normal respiratory effort without tachypnea or retractions. Lungs CTAB. Good air entry to the bases with no decreased or absent breath sounds. Gastrointestinal: Bowel sounds 4 quadrants. Soft and nontender to palpation. No guarding or rigidity. No palpable masses. No distention. No CVA tenderness. Genitourinary: Declines genital exam at this time Musculoskeletal: Full range of motion to all extremities. No gross deformities appreciated. Neurologic:  Normal speech and language. No gross focal neurologic deficits are appreciated.  Skin:  Skin is warm, dry and intact. No rash noted. Psychiatric: Mood and affect are normal. Speech and behavior are normal. Patient exhibits appropriate insight and judgement.   ____________________________________________   LABS (all labs ordered are listed, but only abnormal results are displayed)  Labs Reviewed  URINE CYTOLOGY ANCILLARY ONLY   ____________________________________________  EKG   ____________________________________________  RADIOLOGY   No results found.  ____________________________________________    PROCEDURES  Procedure(s) performed:    Procedures    Medications  cefTRIAXone (ROCEPHIN) injection 500 mg (has no administration in time range)     ____________________________________________   INITIAL IMPRESSION / ASSESSMENT AND PLAN / ED COURSE  Pertinent labs & imaging results that were available during my care of the patient were reviewed by me and considered in my medical decision making (see chart for details).  Review of the [place] CSRS was performed in accordance of the [facility] prior to dispensing any controlled drugs.           Patient's diagnosis is consistent with exposure to STD.  Patient presented to the emergency department complaining of penile discharge and dysuria x7 to 10 days.  He  states that a recent sexual partner tested positive for trichomoniasis.  Given the symptoms there is also concern for gonorrhea and chlamydia and patient will be tested for same.  Empiric treatment with Rocephin, doxycycline and Flagyl will be administered at this time..  Patient will follow results on MyChart.  Return precautions discussed with the patient.  Follow-up primary care as needed.     ____________________________________________  FINAL CLINICAL IMPRESSION(S) / DIAGNOSES  Final diagnoses:  Exposure to STD      NEW MEDICATIONS STARTED DURING THIS VISIT:  ED Discharge Orders         Ordered    metroNIDAZOLE (FLAGYL) 500 MG tablet   Once        08/28/20 1336    doxycycline (VIBRA-TABS) 100 MG tablet  2 times daily        08/28/20 1336              This chart was dictated using  voice recognition software/Dragon. Despite best efforts to proofread, errors can occur which can change the meaning. Any change was purely unintentional.    Racheal Patches, PA-C 08/28/20 1337

## 2020-09-01 LAB — CERVICOVAGINAL ANCILLARY ONLY
Chlamydia: NEGATIVE
Comment: NEGATIVE
Comment: NEGATIVE
Comment: NORMAL
Neisseria Gonorrhea: NEGATIVE
Trichomonas: NEGATIVE

## 2020-11-12 ENCOUNTER — Other Ambulatory Visit: Payer: Self-pay | Admitting: Gastroenterology

## 2020-11-12 ENCOUNTER — Other Ambulatory Visit: Payer: Self-pay

## 2020-12-25 ENCOUNTER — Telehealth: Payer: Self-pay | Admitting: Gastroenterology

## 2020-12-25 NOTE — Telephone Encounter (Signed)
Return call to patient. Pt states that Esomeprazole does work for him, but had questions about how long he should be on this medication. I advise patient to continue Esomeprazole until his follow-up appt in Nov, at that time he can discuss with Dr. Meridee Score. Patient states that he had plenty of refills until then.

## 2020-12-25 NOTE — Telephone Encounter (Signed)
Inbound call from pt requesting a call back about his prescription Esomeprazole 40mg . He wants to know how long is he to take his medication or can his dosage be higher since his medication isn't working for him. Please advise. Thank you.

## 2020-12-28 NOTE — Telephone Encounter (Signed)
Thank you for the update. While we make a transition of Nexium to Dexilant or Aciphex. If Dexilant then may be on 30 mg daily. If Aciphex then may be on 20 mg daily. He may see me in clinic as scheduled, there was thought of follow-up EGD for the gastric ulcer that we found.  We can move forward with scheduling that if he desires otherwise he can be seen in clinic. Thanks. GM

## 2020-12-29 NOTE — Telephone Encounter (Signed)
Patient would like to come and discuss options further and will remain on Esomeprazole until his visit.

## 2021-02-02 ENCOUNTER — Ambulatory Visit: Payer: 59 | Admitting: Gastroenterology

## 2021-03-06 ENCOUNTER — Encounter (HOSPITAL_COMMUNITY): Payer: Self-pay

## 2021-03-06 ENCOUNTER — Other Ambulatory Visit: Payer: Self-pay

## 2021-03-06 ENCOUNTER — Ambulatory Visit (HOSPITAL_COMMUNITY)
Admission: EM | Admit: 2021-03-06 | Discharge: 2021-03-06 | Disposition: A | Discharge status: Home / Self Care | Payer: 59 | Attending: Emergency Medicine | Admitting: Emergency Medicine

## 2021-03-06 DIAGNOSIS — Z202 Contact with and (suspected) exposure to infections with a predominantly sexual mode of transmission: Secondary | ICD-10-CM | POA: Diagnosis not present

## 2021-03-06 MED ORDER — DOXYCYCLINE HYCLATE 100 MG PO CAPS: 100.0000 mg | ORAL_CAPSULE | Freq: Two times a day (BID) | ORAL | 0 refills | Status: AC

## 2021-03-06 MED ORDER — CEFTRIAXONE SODIUM 500 MG IJ SOLR
500.0000 mg | Freq: Once | INTRAMUSCULAR | Status: AC
  Administered 2021-03-06: 500 mg via INTRAMUSCULAR

## 2021-03-06 MED ORDER — CEFTRIAXONE SODIUM 500 MG IJ SOLR
INTRAMUSCULAR | Status: AC
  Filled 2021-03-06: qty 500

## 2021-03-06 NOTE — Discharge Instructions (Signed)
Take Doxycycline twice daily for the next seven days.  °

## 2021-03-06 NOTE — ED Provider Notes (Signed)
[facility]  ____________________________________________  Time seen: Approximately 12:41 PM  I have reviewed the triage vital signs and the nursing notes.   HISTORY  Chief Complaint SEXUALLY TRANSMITTED DISEASE   Historian Patient    HPI Brandon Olson is a 35 y.o. male presents to the urgent care with penile discharge that started Monday.  Denies dysuria, fever or flank pain.  States that he has had recent unprotected sex.  No penile rash.   History reviewed. No pertinent past medical history.   Immunizations up to date:  Yes.     History reviewed. No pertinent past medical history.  Patient Active Problem List   Diagnosis Date Noted   Abdominal cramping 05/26/2020   Decreased appetite 05/26/2020   History of Helicobacter infection 05/26/2020   Dyspepsia 05/26/2020   History of Helicobacter pylori infection 05/26/2020   Change in bowel habits 05/26/2020   Routine health maintenance 12/23/2012    History reviewed. No pertinent surgical history.  Prior to Admission medications   Medication Sig Start Date End Date Taking? Authorizing Provider  doxycycline (VIBRAMYCIN) 100 MG capsule Take 1 capsule (100 mg total) by mouth 2 (two) times daily for 7 days. 03/06/21 03/13/21 Yes Pia Mau M, PA-C  esomeprazole (NEXIUM) 40 MG capsule TAKE ONE CAPSULE BY MOUTH TWICE DAILY BEFORE A MEAL TAKE WITH WATER ONLY AND AT LEAST 30 MINUTES BEFORE MEAL. 11/12/20   Mansouraty, Netty Starring., MD    Allergies Shrimp [shellfish allergy]  Family History  Problem Relation Age of Onset   Healthy Mother    Diabetes Mother    Healthy Father    Diabetes Maternal Grandmother    Colon cancer Neg Hx    Esophageal cancer Neg Hx    Inflammatory bowel disease Neg Hx    Liver disease Neg Hx    Pancreatic cancer Neg Hx    Rectal cancer Neg Hx    Stomach cancer Neg Hx     Social History Social History   Tobacco Use   Smoking status: Never   Smokeless tobacco: Never   Vaping Use   Vaping Use: Never used  Substance Use Topics   Alcohol use: Yes    Alcohol/week: 15.0 standard drinks    Types: 5 Cans of beer, 10 Shots of liquor per week    Comment: social   Drug use: Yes    Types: Marijuana    Comment: 05/28/20     Review of Systems  Constitutional: No fever/chills Eyes:  No discharge ENT: No upper respiratory complaints. Respiratory: no cough. No SOB/ use of accessory muscles to breath Gastrointestinal:   No nausea, no vomiting.  No diarrhea.  No constipation. Musculoskeletal: Negative for musculoskeletal pain. Skin: Negative for rash, abrasions, lacerations, ecchymosis.    ____________________________________________   PHYSICAL EXAM:  VITAL SIGNS: ED Triage Vitals  Enc Vitals Group     BP 03/06/21 1157 120/78     Pulse Rate 03/06/21 1157 74     Resp 03/06/21 1157 18     Temp 03/06/21 1157 98.8 F (37.1 C)     Temp Source 03/06/21 1157 Oral     SpO2 03/06/21 1157 96 %     Weight --      Height --      Head Circumference --      Peak Flow --      Pain Score 03/06/21 1158 0     Pain Loc --      Pain Edu? --  Excl. in GC? --      Constitutional: Alert and oriented. Well appearing and in no acute distress. Eyes: Conjunctivae are normal. PERRL. EOMI. Head: Atraumatic. ENT:      Nose: No congestion/rhinnorhea.      Mouth/Throat: Mucous membranes are moist.  Neck: No stridor.  No cervical spine tenderness to palpation. Cardiovascular: Normal rate, regular rhythm. Normal S1 and S2.  Good peripheral circulation. Respiratory: Normal respiratory effort without tachypnea or retractions. Lungs CTAB. Good air entry to the bases with no decreased or absent breath sounds Gastrointestinal: Bowel sounds x 4 quadrants. Soft and nontender to palpation. No guarding or rigidity. No distention. Musculoskeletal: Full range of motion to all extremities. No obvious deformities noted Neurologic:  Normal for age. No gross focal neurologic  deficits are appreciated.  Skin:  Skin is warm, dry and intact. No rash noted. Psychiatric: Mood and affect are normal for age. Speech and behavior are normal.   ____________________________________________   LABS (all labs ordered are listed, but only abnormal results are displayed)  Labs Reviewed  CYTOLOGY, (ORAL, ANAL, URETHRAL) ANCILLARY ONLY   ____________________________________________  EKG   ____________________________________________  RADIOLOGY   No results found.  ____________________________________________    PROCEDURES  Procedure(s) performed:     Procedures     Medications  cefTRIAXone (ROCEPHIN) injection 500 mg (has no administration in time range)     ____________________________________________   INITIAL IMPRESSION / ASSESSMENT AND PLAN / ED COURSE  Pertinent labs & imaging results that were available during my care of the patient were reviewed by me and considered in my medical decision making (see chart for details).      Assessment and plan STD exposure 35 year old male presents to the urgent care after being potentially exposed to an STD with penile discharge.  Vital signs were reassuring at triage.  On physical exam, patient was alert, active and nontoxic-appearing.  He received an injection of Rocephin and was advised to take doxycycline twice daily for the next 7 days.  Recommended abstaining from unprotected sex for the next 7 days.  Gonorrhea and Chlamydia testing is in process at this time.     ____________________________________________  FINAL CLINICAL IMPRESSION(S) / ED DIAGNOSES  Final diagnoses:  STD exposure      NEW MEDICATIONS STARTED DURING THIS VISIT:  ED Discharge Orders          Ordered    doxycycline (VIBRAMYCIN) 100 MG capsule  2 times daily        03/06/21 1217                This chart was dictated using voice recognition software/Dragon. Despite best efforts to proofread, errors can  occur which can change the meaning. Any change was purely unintentional.     Orvil Feil, PA-C 03/06/21 1245

## 2021-03-06 NOTE — ED Triage Notes (Signed)
Pt c/o penile discharge since Monday. Denies burning or urination difficulties.

## 2021-03-08 LAB — CYTOLOGY, (ORAL, ANAL, URETHRAL) ANCILLARY ONLY
Chlamydia: NEGATIVE
Comment: NEGATIVE
Comment: NEGATIVE
Comment: NORMAL
Neisseria Gonorrhea: NEGATIVE
Trichomonas: NEGATIVE

## 2021-04-23 ENCOUNTER — Ambulatory Visit: Payer: Self-pay | Admitting: Gastroenterology

## 2021-04-23 ENCOUNTER — Encounter: Payer: Self-pay | Admitting: Gastroenterology

## 2021-04-23 VITALS — BP 130/80 | HR 78 | Ht 67.0 in | Wt 154.0 lb

## 2021-04-23 DIAGNOSIS — K259 Gastric ulcer, unspecified as acute or chronic, without hemorrhage or perforation: Secondary | ICD-10-CM

## 2021-04-23 DIAGNOSIS — Z8711 Personal history of peptic ulcer disease: Secondary | ICD-10-CM

## 2021-04-23 DIAGNOSIS — R1010 Upper abdominal pain, unspecified: Secondary | ICD-10-CM

## 2021-04-23 DIAGNOSIS — R109 Unspecified abdominal pain: Secondary | ICD-10-CM

## 2021-04-23 DIAGNOSIS — R630 Anorexia: Secondary | ICD-10-CM

## 2021-04-23 DIAGNOSIS — R932 Abnormal findings on diagnostic imaging of liver and biliary tract: Secondary | ICD-10-CM

## 2021-04-23 MED ORDER — ESOMEPRAZOLE MAGNESIUM 40 MG PO CPDR: DELAYED_RELEASE_CAPSULE | ORAL | 4 refills | Status: DC

## 2021-04-23 NOTE — Patient Instructions (Addendum)
You have been scheduled for an endoscopy. Please follow written instructions given to you at your visit today. If you use inhalers (even only as needed), please bring them with you on the day of your procedure.  We have sent the following medications to your pharmacy for you to pick up at your convenience: Nexium   If you are age 38 or older, your body mass index should be between 23-30. Your Body mass index is 24.12 kg/m. If this is out of the aforementioned range listed, please consider follow up with your Primary Care Provider.  If you are age 72 or younger, your body mass index should be between 19-25. Your Body mass index is 24.12 kg/m. If this is out of the aformentioned range listed, please consider follow up with your Primary Care Provider.   ________________________________________________________  The [facility] providers would like to encourage you to use [facility] to communicate with providers for non-urgent requests or questions.  Due to long hold times on the telephone, sending your provider a message by [facility] may be a faster and more efficient way to get a response.  Please allow 48 business hours for a response.  Please remember that this is for non-urgent requests.  _______________________________________________________  Due to recent changes in healthcare laws, you may see the results of your imaging and laboratory studies on MyChart before your provider has had a chance to review them.  We understand that in some cases there may be results that are confusing or concerning to you. Not all laboratory results come back in the same time frame and the provider may be waiting for multiple results in order to interpret others.  Please give [place] 48 hours in order for your provider to thoroughly review all the results before contacting the office for clarification of your results.   Thank you for choosing me and [facility].  Dr. Rush Landmark

## 2021-04-24 ENCOUNTER — Encounter: Payer: Self-pay | Admitting: Gastroenterology

## 2021-04-24 DIAGNOSIS — K259 Gastric ulcer, unspecified as acute or chronic, without hemorrhage or perforation: Secondary | ICD-10-CM | POA: Insufficient documentation

## 2021-04-24 DIAGNOSIS — R1010 Upper abdominal pain, unspecified: Secondary | ICD-10-CM | POA: Insufficient documentation

## 2021-04-24 NOTE — Progress Notes (Signed)
GASTROENTEROLOGY OUTPATIENT CLINIC VISIT   Primary Care Provider Norm SaltVanstory, Ashley N, [place] [address] [place] [address] [phone]   Patient Profile: Brandon Olson is a 36 y.o. male with a pmh significant for reported H. Pylori infection (breath test and then treated with negative biopsies in 2022), chronic dyspepsia, PUD (manifested as GUs).  The patient presents to the [facility] for an evaluation and management of problem(s) noted below:  Problem List 1. History of multiple gastric ulcers   2. Pain of upper abdomen   3. Decreased appetite   4. Abnormal liver ultrasound     History of Present Illness Please see prior notes for full details of HPI.  Interval History The patient for follow-up this time.  Due to a change in his insurance, he is currently self-pay.  Overall, he has been doing better as long as he takes his PPI therapy.  His decreased appetite, discomfort is much improved when he does not take his medication.  If he misses multiple days, he does get some symptoms that recur.  He is not experiencing significant weight loss at this time.  No alteration of his bowel habits.  He has stopped THC use, as a result of a job that he is training for at this time.  He is happy with that decision and wants to continue to maintain this.  He has high aspiration of being able to provide for his family.  GI Review of Systems Positive as above Negative for pyrosis, dysphagia, odynophagia, nausea, vomiting, melena, hematochezia  Review of Systems General: Denies fevers/chills/weight loss unintentionally Cardiovascular: Denies chest pain Pulmonary: Denies shortness of breath Gastroenterological: See HPI Genitourinary: Denies darkened urine Hematological: Denies easy bruising/bleeding Dermatological: Denies jaundice Psychological: Mood is stable   Medications Current Outpatient Medications  Medication Sig Dispense Refill   esomeprazole (NEXIUM)  40 MG capsule TAKE ONE CAPSULE BY MOUTH TWICE DAILY BEFORE A MEAL TAKE WITH WATER ONLY AND AT LEAST 30 MINUTES BEFORE MEAL. 180 capsule 4   No current facility-administered medications for this visit.    Allergies Allergies  Allergen Reactions   Shrimp [Shellfish Allergy] Nausea Only    Histories Past Medical History:  Diagnosis Date   GERD (gastroesophageal reflux disease)    History reviewed. No pertinent surgical history. Social History   Socioeconomic History   Marital status: Single    Spouse name: Not on file   Number of children: Not on file   Years of education: Not on file   Highest education level: Not on file  Occupational History   Not on file  Tobacco Use   Smoking status: Never   Smokeless tobacco: Never  Vaping Use   Vaping Use: Never used  Substance and Sexual Activity   Alcohol use: Yes    Alcohol/week: 15.0 standard drinks    Types: 5 Cans of beer, 10 Shots of liquor per week    Comment: social   Drug use: Yes    Types: Marijuana    Comment: 05/28/20   Sexual activity: Yes    Birth control/protection: None  Other Topics Concern   Not on file  Social History Narrative   Not on file   Social Determinants of Health   Financial Resource Strain: Not on file  Food Insecurity: Not on file  Transportation Needs: Not on file  Physical Activity: Not on file  Stress: Not on file  Social Connections: Not on file  Intimate Partner Violence: Not on file  Family History  Problem Relation Age of Onset   Healthy Mother    Diabetes Mother    Healthy Father    Diabetes Maternal Grandmother    Colon cancer Neg Hx    Esophageal cancer Neg Hx    Inflammatory bowel disease Neg Hx    Liver disease Neg Hx    Pancreatic cancer Neg Hx    Rectal cancer Neg Hx    Stomach cancer Neg Hx    I have reviewed his medical, social, and family history in detail and updated the electronic medical record as necessary.    PHYSICAL EXAMINATION  BP 130/80    Pulse 78     Ht 5\' 7"  (1.702 m)    Wt 154 lb (69.9 kg)    BMI 24.12 kg/m  Wt Readings from Last 3 Encounters:  04/23/21 154 lb (69.9 kg)  05/28/20 152 lb (68.9 kg)  05/22/20 152 lb 3.2 oz (69 kg)  GEN: NAD, appears stated age, doesn't appear chronically ill PSYCH: Cooperative, without pressured speech EYE: Conjunctivae pink, sclerae anicteric ENT: MMM CV: Nontachycardic RESP: No audible wheezing GI: NABS, soft, NT/ND, without rebound or guarding MSK/EXT: No lower extremity edema SKIN: No jaundice, no spider angiomata NEURO:  Alert & Oriented x 3, no focal deficits   REVIEW OF DATA  I reviewed the following data at the time of this encounter:  GI Procedures and Studies  February 2022 EGD - White nummular lesions in esophageal mucosa. Biopsied. - Z-line regular, 43 cm from the incisors. - Single gastric linear ulcer noted in antrum. Erythematous mucosa in the gastric body and antrum. No other gross lesions in the stomach. Biopsied. - No gross lesions in the duodenal bulb, in the first portion of the duodenum and in the second portion of the duodenum. Biopsied. Pathology Diagnosis 1. Surgical [P], duodenum - DUODENAL MUCOSA WITH NO SIGNIFICANT PATHOLOGIC FINDINGS. - NEGATIVE FOR INCREASED INTRAEPITHELIAL LYMPHOCYTES AND VILLOUS ARCHITECTURAL CHANGES. 2. Surgical [P], gastric - MILD CHRONIC GASTRITIS. - WARTHIN-STARRY STAIN IS NEGATIVE FOR HELICOBACTER PYLORI. 3. Surgical [P], distal esophagus - SQUAMOUS ESOPHAGEAL EPITHELIUM WITH NO SIGNIFICANT PATHOLOGIC FINDINGS. - NEGATIVE FOR INCREASED INTRAEPITHELIAL EOSINOPHILS. - PAS/F STAIN IS NEGATIVE FOR FUNGAL ELEMENTS.    Laboratory Studies  Reviewed those in epic  Imaging Studies  February 2022 abdominal ultrasound IMPRESSION: Hyperechogenicity of the hepatic parenchyma. This is a nonspecific finding, which may be seen in the setting of hepatic steatosis or other chronic hepatic parenchymal disease. Otherwise unremarkable abdominal  ultrasound, as described.   ASSESSMENT  [name] is a 36 y.o. male with a pmh significant for reported H. Pylori infection (breath test and then treated with negative biopsies in 2022), chronic dyspepsia, PUD (manifested as GUs). The patient is seen today for evaluation and management of:  1. History of multiple gastric ulcers   2. Pain of upper abdomen   3. Decreased appetite   4. Abnormal liver ultrasound    The patient is clinically and hemodynamically stable at this time.  He has been doing well with PPI therapy.  He will continue his current PPI therapy for now.  We did discuss potentially trying to decrease his dose as able.  He will benefit from EGD follow-up of his prior gastric ulcers to ensure healing.  We will plan to do this later this year and as long as that looks good and he is still symptomatically doing well that his PPI therapy can be decreased.  We are going to wait a few months  until his insurance issues are improved/changed since he needs to change his insurance from his prior.  With this being said he also will benefit from further evaluation of his liver, in the setting of laboratories.  As he had undergone previous imaging in 2022 of his abdomen and he had some liver echotexture changes, we had ordered additional laboratories which are currently pended.  He is doing well overall, so I am okay with waiting on his liver test to be performed, but I do think they need to be performed.  They would likely expire before his insurance changes so we will extend all of his laboratories for another 6 to 12 months.  I have commended the patient again for his THC being stopped.  The risks and benefits of endoscopic evaluation were discussed with the patient; these include but are not limited to the risk of perforation, infection, bleeding, missed lesions, lack of diagnosis, severe illness requiring hospitalization, as well as anesthesia and sedation related illnesses.  The patient and/or family  is agreeable to proceed.  All patient questions were answered to the best of my ability, and the patient agrees to the aforementioned plan of action with follow-up as indicated.   PLAN  Continue Nexium 40 mg twice daily for now - May consider decreasing to once daily if symptoms remain well controlled Surveillance EGD for gastric ulcer healing to be scheduled later this year (once insurance has been changed) HFP, iron/TIBC/ferritin, INR, HAV total antibody, HBV surface antibody, HBV surface antigen, HBV core antibody, HCV antibody, HIV antibody to be obtained due to his abnormal liver ultrasound and echotexture at some point (these are currently pended but will be extended further)   Orders Placed This Encounter  Procedures   Ambulatory referral to Gastroenterology    New Prescriptions   No medications on file   Modified Medications   Modified Medication Previous Medication   ESOMEPRAZOLE (NEXIUM) 40 MG CAPSULE esomeprazole (NEXIUM) 40 MG capsule      TAKE ONE CAPSULE BY MOUTH TWICE DAILY BEFORE A MEAL TAKE WITH WATER ONLY AND AT LEAST 30 MINUTES BEFORE MEAL.    TAKE ONE CAPSULE BY MOUTH TWICE DAILY BEFORE A MEAL TAKE WITH WATER ONLY AND AT LEAST 30 MINUTES BEFORE MEAL.    Planned Follow Up No follow-ups on file.   Total Time in Face-to-Face and in Coordination of Care for patient including independent/personal interpretation/review of prior testing, medical history, examination, medication adjustment, communicating results with the patient directly, and documentation with the EHR is 20 minutes.   Corliss Parish, MD [facility] Advanced Endoscopy Office # [phone]

## 2021-04-26 DIAGNOSIS — R932 Abnormal findings on diagnostic imaging of liver and biliary tract: Secondary | ICD-10-CM | POA: Insufficient documentation

## 2021-06-22 ENCOUNTER — Ambulatory Visit (AMBULATORY_SURGERY_CENTER): Payer: 59 | Admitting: Gastroenterology

## 2021-06-22 ENCOUNTER — Other Ambulatory Visit (INDEPENDENT_AMBULATORY_CARE_PROVIDER_SITE_OTHER): Payer: 59

## 2021-06-22 ENCOUNTER — Other Ambulatory Visit: Payer: Self-pay

## 2021-06-22 ENCOUNTER — Encounter: Payer: Self-pay | Admitting: Gastroenterology

## 2021-06-22 VITALS — BP 128/75 | HR 62 | Temp 98.2°F | Resp 17 | Ht 67.0 in | Wt 154.0 lb

## 2021-06-22 DIAGNOSIS — R932 Abnormal findings on diagnostic imaging of liver and biliary tract: Secondary | ICD-10-CM

## 2021-06-22 DIAGNOSIS — Z8719 Personal history of other diseases of the digestive system: Secondary | ICD-10-CM | POA: Diagnosis present

## 2021-06-22 DIAGNOSIS — K259 Gastric ulcer, unspecified as acute or chronic, without hemorrhage or perforation: Secondary | ICD-10-CM

## 2021-06-22 LAB — HEPATIC FUNCTION PANEL
ALT: 13 U/L (ref 0–53)
AST: 14 U/L (ref 0–37)
Albumin: 4.3 g/dL (ref 3.5–5.2)
Alkaline Phosphatase: 64 U/L (ref 39–117)
Bilirubin, Direct: 0.1 mg/dL (ref 0.0–0.3)
Total Bilirubin: 0.5 mg/dL (ref 0.2–1.2)
Total Protein: 7 g/dL (ref 6.0–8.3)

## 2021-06-22 LAB — IBC + FERRITIN
Ferritin: 142.4 ng/mL (ref 22.0–322.0)
Iron: 108 ug/dL (ref 42–165)
Saturation Ratios: 37.1 % (ref 20.0–50.0)
TIBC: 291.2 ug/dL (ref 250.0–450.0)
Transferrin: 208 mg/dL — ABNORMAL LOW (ref 212.0–360.0)

## 2021-06-22 MED ORDER — SODIUM CHLORIDE 0.9 % IV SOLN: 500.0000 mL | Freq: Once | INTRAVENOUS | Status: DC

## 2021-06-22 NOTE — Progress Notes (Signed)
? ?GASTROENTEROLOGY PROCEDURE H&P NOTE  ? ?Primary Care Physician: ?Norm Salt, PA ? ?HPI: ?Brandon Olson is a 36 y.o. male who presents for EGD for follow up of prior Gastric ulcers and gastritis. ? ?Past Medical History:  ?Diagnosis Date  ? GERD (gastroesophageal reflux disease)   ? ?No past surgical history on file. ?Current Outpatient Medications  ?Medication Sig Dispense Refill  ? esomeprazole (NEXIUM) 40 MG capsule TAKE ONE CAPSULE BY MOUTH TWICE DAILY BEFORE A MEAL TAKE WITH WATER ONLY AND AT LEAST 30 MINUTES BEFORE MEAL. 180 capsule 4  ? ?Current Facility-Administered Medications  ?Medication Dose Route Frequency Provider Last Rate Last Admin  ? 0.9 %  sodium chloride infusion  500 mL Intravenous Once Mansouraty, Netty Starring., MD      ? ? ?Current Outpatient Medications:  ?  esomeprazole (NEXIUM) 40 MG capsule, TAKE ONE CAPSULE BY MOUTH TWICE DAILY BEFORE A MEAL TAKE WITH WATER ONLY AND AT LEAST 30 MINUTES BEFORE MEAL., Disp: 180 capsule, Rfl: 4 ? ?Current Facility-Administered Medications:  ?  0.9 %  sodium chloride infusion, 500 mL, Intravenous, Once, Mansouraty, Netty Starring., MD ?Allergies  ?Allergen Reactions  ? Shrimp [Shellfish Allergy] Nausea Only  ? ?Family History  ?Problem Relation Age of Onset  ? Healthy Mother   ? Diabetes Mother   ? Healthy Father   ? Diabetes Maternal Grandmother   ? Colon cancer Neg Hx   ? Esophageal cancer Neg Hx   ? Inflammatory bowel disease Neg Hx   ? Liver disease Neg Hx   ? Pancreatic cancer Neg Hx   ? Rectal cancer Neg Hx   ? Stomach cancer Neg Hx   ? ?Social History  ? ?Socioeconomic History  ? Marital status: Single  ?  Spouse name: Not on file  ? Number of children: Not on file  ? Years of education: Not on file  ? Highest education level: Not on file  ?Occupational History  ? Not on file  ?Tobacco Use  ? Smoking status: Never  ? Smokeless tobacco: Never  ?Vaping Use  ? Vaping Use: Never used  ?Substance and Sexual Activity  ? Alcohol use: Yes  ?   Alcohol/week: 15.0 standard drinks  ?  Types: 5 Cans of beer, 10 Shots of liquor per week  ?  Comment: social  ? Drug use: Yes  ?  Types: Marijuana  ?  Comment: 05/28/20  ? Sexual activity: Yes  ?  Birth control/protection: None  ?Other Topics Concern  ? Not on file  ?Social History Narrative  ? Not on file  ? ?Social Determinants of Health  ? ?Financial Resource Strain: Not on file  ?Food Insecurity: Not on file  ?Transportation Needs: Not on file  ?Physical Activity: Not on file  ?Stress: Not on file  ?Social Connections: Not on file  ?Intimate Partner Violence: Not on file  ? ? ?Physical Exam: ?Today's Vitals  ? 06/22/21 0817  ?BP: (!) 130/94  ?Pulse: 60  ?Temp: 98.2 ?F (36.8 ?C)  ?TempSrc: Temporal  ?SpO2: 99%  ?Weight: 154 lb (69.9 kg)  ?Height: 5\' 7"  (1.702 m)  ? ?Body mass index is 24.12 kg/m?. ?GEN: NAD ?EYE: Sclerae anicteric ?ENT: MMM ?CV: Non-tachycardic ?GI: Soft, NT/ND ?NEURO:  Alert & Oriented x 3 ? ?Lab Results: ?No results for input(s): WBC, HGB, HCT, PLT in the last 72 hours. ?BMET ?No results for input(s): NA, K, CL, CO2, GLUCOSE, BUN, CREATININE, CALCIUM in the last 72 hours. ?LFT ?No results for  input(s): PROT, ALBUMIN, AST, ALT, ALKPHOS, BILITOT, BILIDIR, IBILI in the last 72 hours. ?PT/INR ?No results for input(s): LABPROT, INR in the last 72 hours. ? ? ?Impression / Plan: ?This is a 36 y.o.male  who presents for EGD for follow up of prior Gastric ulcers and gastritis. ? ?The risks and benefits of endoscopic evaluation/treatment were discussed with the patient and/or family; these include but are not limited to the risk of perforation, infection, bleeding, missed lesions, lack of diagnosis, severe illness requiring hospitalization, as well as anesthesia and sedation related illnesses.  The patient's history has been reviewed, patient examined, no change in status, and deemed stable for procedure.  The patient and/or family is agreeable to proceed.  ? ? ?Corliss Parish, MD ?[facility] ?Advanced Endoscopy ?Office # [phone] ? ?

## 2021-06-22 NOTE — Progress Notes (Signed)
VS completed by CW.   Pt's states no medical or surgical changes since previsit or office visit.  

## 2021-06-22 NOTE — Op Note (Signed)
[facility] ?Patient Name: Brandon Olson ?Procedure Date: 06/22/2021 8:55 AM ?MRN: [id number] ?Endoscopist: Justice Britain , MD ?Age: 36 ?Referring MD:  ?Date of Birth: 01-Mar-1986 ?Gender: Male ?Account #: [id number] ?Procedure:                Upper GI endoscopy ?Indications:              Follow-up of gastric ulcer ?Medicines:                Monitored Anesthesia Care ?Procedure:                Pre-Anesthesia Assessment: ?                          - Prior to the procedure, a History and Physical  ?                          was performed, and patient medications and  ?                          allergies were reviewed. The patient's tolerance of  ?                          previous anesthesia was also reviewed. The risks  ?                          and benefits of the procedure and the sedation  ?                          options and risks were discussed with the patient.  ?                          All questions were answered, and informed consent  ?                          was obtained. Prior Anticoagulants: The patient has  ?                          taken no previous anticoagulant or antiplatelet  ?                          agents. ASA Grade Assessment: II - A patient with  ?                          mild systemic disease. After reviewing the risks  ?                          and benefits, the patient was deemed in  ?                          satisfactory condition to undergo the procedure. ?                          After obtaining informed consent, the endoscope was  ?  passed under direct vision. Throughout the  ?                          procedure, the patient's blood pressure, pulse, and  ?                          oxygen saturations were monitored continuously. The  ?                          Endoscope was introduced through the mouth, and  ?                          advanced to the second part of duodenum. The upper  ?                          GI endoscopy was accomplished  without difficulty.  ?                          The patient tolerated the procedure. ?Scope In: ?Scope Out: ?Findings:                 No gross lesions were noted in the entire esophagus. ?                          The Z-line was irregular and was found 43 cm from  ?                          the incisors. ?                          No gross lesions were noted in the entire examined  ?                          stomach. ?                          No gross lesions were noted in the duodenal bulb,  ?                          in the first portion of the duodenum and in the  ?                          second portion of the duodenum. ?Complications:            No immediate complications. ?Estimated Blood Loss:     Estimated blood loss: none. ?Impression:               - No gross lesions in esophagus. ?                          - Z-line irregular, 43 cm from the incisors. ?                          - No gross lesions in the stomach. ?                          -  No gross lesions in the duodenal bulb, in the  ?                          first portion of the duodenum and in the second  ?                          portion of the duodenum. ?Recommendation:           - The patient will be observed post-procedure,  ?                          until all discharge criteria are met. ?                          - Discharge patient to home. ?                          - Patient has a contact number available for  ?                          emergencies. The signs and symptoms of potential  ?                          delayed complications were discussed with the  ?                          patient. Return to normal activities tomorrow.  ?                          Written discharge instructions were provided to the  ?                          patient. ?                          - Resume previous diet. ?                          - Continue present medications. ?                          - Move forward with laboratories for further  ?                           evaluation of abnormal LFTs (these have been  ?                          ordered). ?                          - The findings and recommendations were discussed  ?                          with the patient. ?Justice Britain, MD ?06/22/2021 9:44:18 AM ?

## 2021-06-22 NOTE — Progress Notes (Signed)
To Pacu, VSS. Report to [email or web address] 

## 2021-06-22 NOTE — Patient Instructions (Addendum)
?Continue your medications. ?Please call if any questions or concerns. ?  ? ? ?YOU HAD AN ENDOSCOPIC PROCEDURE TODAY AT THE [facility]:   Refer to the procedure report that was given to you for any specific questions about what was found during the examination.  If the procedure report does not answer your questions, please call your gastroenterologist to clarify.  If you requested that your care partner not be given the details of your procedure findings, then the procedure report has been included in a sealed envelope for you to review at your convenience later. ? ?YOU SHOULD EXPECT: Some feelings of bloating in the abdomen. Passage of more gas than usual.  Walking can help get rid of the air that was put into your GI tract during the procedure and reduce the bloating. If you had a lower endoscopy (such as a colonoscopy or flexible sigmoidoscopy) you may notice spotting of blood in your stool or on the toilet paper. If you underwent a bowel prep for your procedure, you may not have a normal bowel movement for a few days. ? ?Please Note:  You might notice some irritation and congestion in your nose or some drainage.  This is from the oxygen used during your procedure.  There is no need for concern and it should clear up in a day or so. ? ?SYMPTOMS TO REPORT IMMEDIATELY: ? ?Following upper endoscopy (EGD) ? Vomiting of blood or coffee ground material ? New chest pain or pain under the shoulder blades ? Painful or persistently difficult swallowing ? New shortness of breath ? Fever of 100?F or higher ? Black, tarry-looking stools ? ?For urgent or emergent issues, a gastroenterologist can be reached at any hour by calling [phone]. ?Do not use MyChart messaging for urgent concerns.  ? ? ?DIET:  We do recommend a small meal at first, but then you may proceed to your regular diet.  Drink plenty of fluids but you should avoid alcoholic beverages for 24 hours. ? ?ACTIVITY:  You should plan to take it  easy for the rest of today and you should NOT DRIVE or use heavy machinery until tomorrow (because of the sedation medicines used during the test).   ? ?FOLLOW UP: ?Our staff will call the number listed on your records 48-72 hours following your procedure to check on you and address any questions or concerns that you may have regarding the information given to you following your procedure. If we do not reach you, we will leave a message.  We will attempt to reach you two times.  During this call, we will ask if you have developed any symptoms of COVID 19. If you develop any symptoms (ie: fever, flu-like symptoms, shortness of breath, cough etc.) before then, please call [phone].  If you test positive for Covid 19 in the 2 weeks post procedure, please call and report this information to [place].   ? ?If any biopsies were taken you will be contacted by phone or by letter within the next 1-3 weeks.  Please call us at [phone] if you have not heard about the biopsies in 3 weeks.  ? ? ?SIGNATURES/CONFIDENTIALITY: ?You and/or your care partner have signed paperwork which will be entered into your electronic medical record.  These signatures attest to the fact that that the information above on your After Visit Summary has been reviewed and is understood.  Full responsibility of the confidentiality of this discharge information lies with you and/or your care-partner.  ?

## 2021-06-22 NOTE — Progress Notes (Signed)
Pt was asleep when Dr. Meridee Score came in to speak with him.  He spoke with pt after MD did the next procedure. ? ?Pt taken to the lab for blood work on discharge. ? ?No problems noted in the recovery room. maw  ?

## 2021-06-23 LAB — HEPATITIS A ANTIBODY, TOTAL: Hepatitis A AB,Total: NONREACTIVE

## 2021-06-23 LAB — HEPATITIS B SURFACE ANTIGEN: Hepatitis B Surface Ag: NONREACTIVE

## 2021-06-23 LAB — HEPATITIS B CORE ANTIBODY, TOTAL: Hep B Core Total Ab: NONREACTIVE

## 2021-06-23 LAB — HEPATITIS B SURFACE ANTIBODY,QUALITATIVE: Hep B S Ab: REACTIVE — AB

## 2021-06-24 ENCOUNTER — Telehealth: Payer: Self-pay

## 2021-06-24 NOTE — Telephone Encounter (Signed)
?  Follow up Call- ? ? ?  06/22/2021  ?  8:18 AM 05/28/2020  ?  3:18 PM  ?Call back number  ?Post procedure Call Back phone  # [phone]  ?Permission to leave phone message Yes Yes  ?  ? ?Patient questions: ? ?Do you have a fever, pain , or abdominal swelling? No. ?Pain Score  0 * ? ?Have you tolerated food without any problems? Yes.   ? ?Have you been able to return to your normal activities? Yes.   ? ?Do you have any questions about your discharge instructions: ?Diet   No. ?Medications  No. ?Follow up visit  No. ? ?Do you have questions or concerns about your Care? No. ? ?Actions: ?* If pain score is 4 or above: ?No action needed, pain <4. ? ? ?

## 2022-07-08 ENCOUNTER — Ambulatory Visit (HOSPITAL_COMMUNITY)
Admission: EM | Admit: 2022-07-08 | Discharge: 2022-07-08 | Disposition: A | Discharge status: Home / Self Care | Payer: Managed Care, Other (non HMO) | Attending: Physician Assistant | Admitting: Physician Assistant

## 2022-07-08 ENCOUNTER — Encounter (HOSPITAL_COMMUNITY): Payer: Self-pay

## 2022-07-08 DIAGNOSIS — Z202 Contact with and (suspected) exposure to infections with a predominantly sexual mode of transmission: Secondary | ICD-10-CM | POA: Diagnosis present

## 2022-07-08 DIAGNOSIS — R369 Urethral discharge, unspecified: Secondary | ICD-10-CM | POA: Insufficient documentation

## 2022-07-08 MED ORDER — DOXYCYCLINE HYCLATE 100 MG PO CAPS: 100.0000 mg | ORAL_CAPSULE | Freq: Two times a day (BID) | ORAL | 0 refills | Status: AC

## 2022-07-08 NOTE — ED Provider Notes (Signed)
[facility]    CSN: [id number] Arrival date & time: 07/08/22  1742      History   Chief Complaint Chief Complaint  Patient presents with   Exposure to STD    HPI Brandon Olson. is a 37 y.o. male.   Patient presents with complaints of penile discharge that started 3 days ago.  He reports a recent sexual partner tested positive for chlamydia.  He denies fever, chills, dysuria, abdominal pain, testicular pain or swelling.  He has tried nothing for the symptoms.    Past Medical History:  Diagnosis Date   GERD (gastroesophageal reflux disease)     Patient Active Problem List   Diagnosis Date Noted   Abnormal liver ultrasound 04/26/2021   Pain of upper abdomen 04/24/2021   History of multiple gastric ulcers 04/24/2021   Abdominal cramping 05/26/2020   Decreased appetite 05/26/2020   History of Helicobacter infection 05/26/2020   Dyspepsia 05/26/2020   History of Helicobacter pylori infection 05/26/2020   Change in bowel habits 05/26/2020   Routine health maintenance 12/23/2012    Past Surgical History:  Procedure Laterality Date   UPPER GASTROINTESTINAL ENDOSCOPY         Home Medications    Prior to Admission medications   Medication Sig Start Date End Date Taking? Authorizing Provider  doxycycline (VIBRAMYCIN) 100 MG capsule Take 1 capsule (100 mg total) by mouth 2 (two) times daily for 7 days. 07/08/22 07/15/22 Yes Ward, Tylene Fantasia, PA-C  esomeprazole (NEXIUM) 40 MG capsule TAKE ONE CAPSULE BY MOUTH TWICE DAILY BEFORE A MEAL TAKE WITH WATER ONLY AND AT LEAST 30 MINUTES BEFORE MEAL. 04/23/21   Mansouraty, Netty Starring., MD    Family History Family History  Problem Relation Age of Onset   Healthy Mother    Diabetes Mother    Healthy Father    Diabetes Maternal Grandmother    Colon cancer Neg Hx    Esophageal cancer Neg Hx    Inflammatory bowel disease Neg Hx    Liver disease Neg Hx    Pancreatic cancer Neg Hx    Rectal cancer Neg Hx    Stomach  cancer Neg Hx     Social History Social History   Tobacco Use   Smoking status: Never   Smokeless tobacco: Never  Vaping Use   Vaping Use: Never used  Substance Use Topics   Alcohol use: Yes    Alcohol/week: 15.0 standard drinks of alcohol    Types: 5 Cans of beer, 10 Shots of liquor per week    Comment: social   Drug use: Not Currently    Types: Marijuana    Comment: 05/28/20     Allergies   Shrimp [shellfish allergy]   Review of Systems Review of Systems  Constitutional:  Negative for chills and fever.  HENT:  Negative for ear pain and sore throat.   Eyes:  Negative for pain and visual disturbance.  Respiratory:  Negative for cough and shortness of breath.   Cardiovascular:  Negative for chest pain and palpitations.  Gastrointestinal:  Negative for abdominal pain and vomiting.  Genitourinary:  Positive for penile discharge. Negative for dysuria and hematuria.  Musculoskeletal:  Negative for arthralgias and back pain.  Skin:  Negative for color change and rash.  Neurological:  Negative for seizures and syncope.  All other systems reviewed and are negative.    Physical Exam Triage Vital Signs ED Triage Vitals  Enc Vitals Group     BP 07/08/22  1756 (!) 137/96     Pulse Rate 07/08/22 1756 (!) 55     Resp 07/08/22 1756 16     Temp 07/08/22 1756 98 F (36.7 C)     Temp src --      SpO2 07/08/22 1756 97 %     Weight --      Height --      Head Circumference --      Peak Flow --      Pain Score 07/08/22 1802 0     Pain Loc --      Pain Edu? --      Excl. in GC? --    No data found.  Updated Vital Signs BP (!) 137/96 (BP Location: Left Arm)   Pulse (!) 55   Temp 98 F (36.7 C)   Resp 16   SpO2 97%   Visual Acuity Right Eye Distance:   Left Eye Distance:   Bilateral Distance:    Right Eye Near:   Left Eye Near:    Bilateral Near:     Physical Exam Vitals and nursing note reviewed.  Constitutional:      General: He is not in acute distress.     Appearance: He is well-developed.  HENT:     Head: Normocephalic and atraumatic.  Eyes:     Conjunctiva/sclera: Conjunctivae normal.  Cardiovascular:     Rate and Rhythm: Normal rate and regular rhythm.     Heart sounds: No murmur heard. Pulmonary:     Effort: Pulmonary effort is normal. No respiratory distress.     Breath sounds: Normal breath sounds.  Abdominal:     Palpations: Abdomen is soft.     Tenderness: There is no abdominal tenderness.  Musculoskeletal:        General: No swelling.     Cervical back: Neck supple.  Skin:    General: Skin is warm and dry.     Capillary Refill: Capillary refill takes less than 2 seconds.  Neurological:     Mental Status: He is alert.  Psychiatric:        Mood and Affect: Mood normal.      UC Treatments / Results  Labs (all labs ordered are listed, but only abnormal results are displayed) Labs Reviewed  CYTOLOGY, (ORAL, ANAL, URETHRAL) ANCILLARY ONLY    EKG   Radiology No results found.  Procedures Procedures (including critical care time)  Medications Ordered in UC Medications - No data to display  Initial Impression / Assessment and Plan / UC Course  I have reviewed the triage vital signs and the nursing notes.  Pertinent labs & imaging results that were available during my care of the patient were reviewed by me and considered in my medical decision making (see chart for details).     STD exposure, penile discharge.  Will treat for chlamydia given known exposure.  Cytology self swab in clinic today.  Will change treatment plan based on results if indicated.  Safe sex practices discussed. Final Clinical Impressions(s) / UC Diagnoses   Final diagnoses:  STD exposure  Penile discharge     Discharge Instructions      Take antibiotic as prescribed Will call with test results and change treatment plan if indicated Abstain from sexual activity until antibiotic completed and symptoms have resolved.     ED  Prescriptions     Medication Sig Dispense Auth. Provider   doxycycline (VIBRAMYCIN) 100 MG capsule Take 1 capsule (100 mg total) by mouth  2 (two) times daily for 7 days. 14 capsule Ward, Tylene FantasiaJessica Z, PA-C      PDMP not reviewed this encounter.   Ward, Tylene FantasiaJessica Z, PA-C 07/08/22 [id number]

## 2022-07-08 NOTE — ED Triage Notes (Signed)
Pt is here for STD testing due to exposure . Pt has been having discharge coming from penis x 3days , pt is states his partner contacted him and informed that she tested positive for chlamydia.

## 2022-07-08 NOTE — Discharge Instructions (Addendum)
Take antibiotic as prescribed Will call with test results and change treatment plan if indicated Abstain from sexual activity until antibiotic completed and symptoms have resolved.

## 2022-07-11 LAB — CYTOLOGY, (ORAL, ANAL, URETHRAL) ANCILLARY ONLY
Chlamydia: NEGATIVE
Comment: NEGATIVE
Comment: NEGATIVE
Comment: NORMAL
Neisseria Gonorrhea: NEGATIVE
Trichomonas: NEGATIVE

## 2023-05-24 ENCOUNTER — Ambulatory Visit (HOSPITAL_COMMUNITY)
Admission: EM | Admit: 2023-05-24 | Discharge: 2023-05-24 | Disposition: A | Discharge status: Home / Self Care | Payer: No Typology Code available for payment source | Attending: Internal Medicine | Admitting: Internal Medicine

## 2023-05-24 ENCOUNTER — Encounter (HOSPITAL_COMMUNITY): Payer: Self-pay

## 2023-05-24 DIAGNOSIS — Z113 Encounter for screening for infections with a predominantly sexual mode of transmission: Secondary | ICD-10-CM

## 2023-05-24 LAB — HIV ANTIBODY (ROUTINE TESTING W REFLEX): HIV Screen 4th Generation wRfx: NONREACTIVE

## 2023-05-24 NOTE — Discharge Instructions (Signed)
We will call you if any of your tests are positive.

## 2023-05-24 NOTE — ED Triage Notes (Signed)
Pt c/o of penile itching and exposure to BV.  Start Date: 05/17/23 or 05/18/23  Exposure: 05/12/2023

## 2023-05-24 NOTE — ED Provider Notes (Signed)
[facility]    CSN: [id number] Arrival date & time: 05/24/23  1512      History   Chief Complaint Chief Complaint  Patient presents with   BV Exposure   Penile Itching    HPI Brandon Olson. is a 38 y.o. male who presents with penile itching on the shaft, but does not have a rash or penile discharge. Has unprotected sex with unknown male last week and developed symptoms the next day. She told him she had BV.     Past Medical History:  Diagnosis Date   GERD (gastroesophageal reflux disease)     Patient Active Problem List   Diagnosis Date Noted   Abnormal liver ultrasound 04/26/2021   Pain of upper abdomen 04/24/2021   History of multiple gastric ulcers 04/24/2021   Abdominal cramping 05/26/2020   Decreased appetite 05/26/2020   History of Helicobacter infection 05/26/2020   Dyspepsia 05/26/2020   History of Helicobacter pylori infection 05/26/2020   Change in bowel habits 05/26/2020   Routine health maintenance 12/23/2012    Past Surgical History:  Procedure Laterality Date   UPPER GASTROINTESTINAL ENDOSCOPY         Home Medications    Prior to Admission medications   Not on File    Family History Family History  Problem Relation Age of Onset   Healthy Mother    Diabetes Mother    Healthy Father    Diabetes Maternal Grandmother    Colon cancer Neg Hx    Esophageal cancer Neg Hx    Inflammatory bowel disease Neg Hx    Liver disease Neg Hx    Pancreatic cancer Neg Hx    Rectal cancer Neg Hx    Stomach cancer Neg Hx     Social History Social History   Tobacco Use   Smoking status: Never   Smokeless tobacco: Never  Vaping Use   Vaping status: Never Used  Substance Use Topics   Alcohol use: Yes    Alcohol/week: 15.0 standard drinks of alcohol    Types: 5 Cans of beer, 10 Shots of liquor per week    Comment: social   Drug use: Yes    Types: Marijuana    Comment: 05/28/20, occasional     Allergies   Shrimp extract and  Shrimp [shellfish allergy]   Review of Systems Review of Systems  As noted in HPI Physical Exam Triage Vital Signs ED Triage Vitals  Encounter Vitals Group     BP 05/24/23 1528 (!) 144/94     Systolic BP Percentile --      Diastolic BP Percentile --      Pulse Rate 05/24/23 1528 (!) 55     Resp 05/24/23 1528 18     Temp 05/24/23 1528 98.3 F (36.8 C)     Temp Source 05/24/23 1528 Oral     SpO2 05/24/23 1528 96 %     Weight --      Height --      Head Circumference --      Peak Flow --      Pain Score 05/24/23 1526 0     Pain Loc --      Pain Education --      Exclude from Growth Chart --    No data found.  Updated Vital Signs BP (!) 144/94 (BP Location: Left Arm)   Pulse (!) 55   Temp 98.3 F (36.8 C) (Oral)   Resp 18   SpO2 96%  Visual Acuity Right Eye Distance:   Left Eye Distance:   Bilateral Distance:    Right Eye Near:   Left Eye Near:    Bilateral Near:     Physical Exam Vitals and nursing note reviewed. Exam conducted with a chaperone present.  Constitutional:      General: He is not in acute distress.    Appearance: He is normal weight.  HENT:     Nose: Nose normal.  Eyes:     General: No scleral icterus.    Conjunctiva/sclera: Conjunctivae normal.  Pulmonary:     Effort: Pulmonary effort is normal.  Genitourinary:    Penis: Normal.      Testes: Normal.     Comments: I obtained the genital swab with pt's concent Musculoskeletal:        General: Normal range of motion.     Cervical back: Neck supple.  Skin:    General: Skin is warm and dry.     Findings: No rash.  Neurological:     Mental Status: He is alert and oriented to person, place, and time.     Gait: Gait normal.  Psychiatric:        Mood and Affect: Mood normal.        Behavior: Behavior normal.        Thought Content: Thought content normal.        Judgment: Judgment normal.      UC Treatments / Results  Labs (all labs ordered are listed, but only abnormal results  are displayed) Labs Reviewed  HIV ANTIBODY (ROUTINE TESTING W REFLEX)  CYTOLOGY, (ORAL, ANAL, URETHRAL) ANCILLARY ONLY    EKG   Radiology No results found.  Procedures Procedures (including critical care time)  Medications Ordered in UC Medications - No data to display  Initial Impression / Assessment and Plan / UC Course  I have reviewed the triage vital signs and the nursing notes.  Exposure to BV Penis shaft itching with normal exam  STD tests ordered and we will inform him if they come back positive.  He was explained that BV does not cause symptoms in men.    Final Clinical Impressions(s) / UC Diagnoses   Final diagnoses:  Screen for STD (sexually transmitted disease)     Discharge Instructions      We will call you if any of your tests are positive      ED Prescriptions   None    PDMP not reviewed this encounter.   Garey Ham, PA-C 05/24/23 1547

## 2023-05-25 LAB — CYTOLOGY, (ORAL, ANAL, URETHRAL) ANCILLARY ONLY
Chlamydia: NEGATIVE
Comment: NEGATIVE
Comment: NEGATIVE
Comment: NORMAL
Neisseria Gonorrhea: NEGATIVE
Trichomonas: NEGATIVE

## 2023-06-06 ENCOUNTER — Encounter (HOSPITAL_COMMUNITY): Payer: Self-pay

## 2023-06-06 ENCOUNTER — Ambulatory Visit (HOSPITAL_COMMUNITY)
Admission: EM | Admit: 2023-06-06 | Discharge: 2023-06-06 | Disposition: A | Discharge status: Home / Self Care | Attending: Physician Assistant | Admitting: Physician Assistant

## 2023-06-06 DIAGNOSIS — Z113 Encounter for screening for infections with a predominantly sexual mode of transmission: Secondary | ICD-10-CM | POA: Diagnosis not present

## 2023-06-06 DIAGNOSIS — L293 Anogenital pruritus, unspecified: Secondary | ICD-10-CM | POA: Diagnosis present

## 2023-06-06 LAB — POCT URINALYSIS DIP (MANUAL ENTRY)
Bilirubin, UA: NEGATIVE
Blood, UA: NEGATIVE
Glucose, UA: NEGATIVE mg/dL
Ketones, POC UA: NEGATIVE mg/dL
Leukocytes, UA: NEGATIVE
Nitrite, UA: NEGATIVE
Protein Ur, POC: NEGATIVE mg/dL
Spec Grav, UA: >=1.03 — AB (ref 1.010–1.025)
Urobilinogen, UA: 0.2 U/dL
pH, UA: 6.5 (ref 5.0–8.0)

## 2023-06-06 NOTE — ED Triage Notes (Signed)
 Pt requesting STD screening. C/o penile itching and irritation for over 2 wks. States seen here for same with negative results. States his partner is having same feelings.

## 2023-06-06 NOTE — ED Provider Notes (Signed)
 [facility]    CSN: [id number] Arrival date & time: 06/06/23  1501      History   Chief Complaint Chief Complaint  Patient presents with   SEXUALLY TRANSMITTED DISEASE    HPI Brandon Olson. is a 38 y.o. male.   HPI  He reports he is having irritation and itching in genital region  He states a few days after he was tested here (05/24/23) his partner reported similar symptoms  He states his partner has not been tested yet but is having itching and irritation   He denies rashes or lesions in the area, abdominal pain or fever, chills  He states he sometimes feels like he has to push or strain to urinate    Past Medical History:  Diagnosis Date   GERD (gastroesophageal reflux disease)     Patient Active Problem List   Diagnosis Date Noted   Abnormal liver ultrasound 04/26/2021   Pain of upper abdomen 04/24/2021   History of multiple gastric ulcers 04/24/2021   Abdominal cramping 05/26/2020   Decreased appetite 05/26/2020   History of Helicobacter infection 05/26/2020   Dyspepsia 05/26/2020   History of Helicobacter pylori infection 05/26/2020   Change in bowel habits 05/26/2020   Routine health maintenance 12/23/2012    Past Surgical History:  Procedure Laterality Date   UPPER GASTROINTESTINAL ENDOSCOPY         Home Medications    Prior to Admission medications   Not on File    Family History Family History  Problem Relation Age of Onset   Healthy Mother    Diabetes Mother    Healthy Father    Diabetes Maternal Grandmother    Colon cancer Neg Hx    Esophageal cancer Neg Hx    Inflammatory bowel disease Neg Hx    Liver disease Neg Hx    Pancreatic cancer Neg Hx    Rectal cancer Neg Hx    Stomach cancer Neg Hx     Social History Social History   Tobacco Use   Smoking status: Never   Smokeless tobacco: Never  Vaping Use   Vaping status: Never Used  Substance Use Topics   Alcohol use: Yes    Alcohol/week: 15.0 standard  drinks of alcohol    Types: 5 Cans of beer, 10 Shots of liquor per week    Comment: social   Drug use: Yes    Types: Marijuana    Comment: 05/28/20, occasional     Allergies   Shrimp extract and Shrimp [shellfish allergy]   Review of Systems Review of Systems  Constitutional:  Negative for chills and fever.  Gastrointestinal:  Negative for abdominal pain.  Genitourinary:  Negative for difficulty urinating, dysuria, frequency, genital sores, penile discharge, penile pain, penile swelling, scrotal swelling and testicular pain.  Skin:  Negative for rash.     Physical Exam Triage Vital Signs ED Triage Vitals  Encounter Vitals Group     BP 06/06/23 1625 (!) 148/92     Systolic BP Percentile --      Diastolic BP Percentile --      Pulse Rate 06/06/23 1625 68     Resp 06/06/23 1625 18     Temp 06/06/23 1625 98.2 F (36.8 C)     Temp Source 06/06/23 1625 Oral     SpO2 06/06/23 1625 98 %     Weight --      Height --      Head Circumference --  Peak Flow --      Pain Score 06/06/23 1626 0     Pain Loc --      Pain Education --      Exclude from Growth Chart --    No data found.  Updated Vital Signs BP (!) 148/92 (BP Location: Left Arm)   Pulse 68   Temp 98.2 F (36.8 C) (Oral)   Resp 18   SpO2 98%   Visual Acuity Right Eye Distance:   Left Eye Distance:   Bilateral Distance:    Right Eye Near:   Left Eye Near:    Bilateral Near:     Physical Exam Vitals reviewed.  Constitutional:      General: He is awake.     Appearance: Normal appearance. He is well-developed and well-groomed.  HENT:     Head: Normocephalic and atraumatic.  Pulmonary:     Effort: Pulmonary effort is normal.  Genitourinary:    Pubic Area: No rash.      Penis: Normal. No phimosis, paraphimosis, hypospadias, erythema, tenderness, discharge, swelling or lesions.      Testes: Normal. Cremasteric reflex is present.     Tanner stage (genital): 5.     Comments: Patient declined  chaperone Patient has some mild dry, scaling skin along the shaft of the penis.  No signs of penile discharge, erythema, rash or broken skin along penis or testicles. Musculoskeletal:     Cervical back: Normal range of motion.  Skin:    General: Skin is warm and dry.  Neurological:     Mental Status: He is alert.  Psychiatric:        Mood and Affect: Mood normal.        Behavior: Behavior normal. Behavior is cooperative.        Thought Content: Thought content normal.        Judgment: Judgment normal.      UC Treatments / Results  Labs (all labs ordered are listed, but only abnormal results are displayed) Labs Reviewed  POCT URINALYSIS DIP (MANUAL ENTRY) - Abnormal; Notable for the following components:      Result Value   Spec Grav, UA >=1.030 (*)    All other components within normal limits  CYTOLOGY, (ORAL, ANAL, URETHRAL) ANCILLARY ONLY    EKG   Radiology No results found.  Procedures Procedures (including critical care time)  Medications Ordered in UC Medications - No data to display  Initial Impression / Assessment and Plan / UC Course  I have reviewed the triage vital signs and the nursing notes.  Pertinent labs & imaging results that were available during my care of the patient were reviewed by me and considered in my medical decision making (see chart for details).      Final Clinical Impressions(s) / UC Diagnoses   Final diagnoses:  Genital pruritus  Screening examination for STD (sexually transmitted disease)   Patient presents today with concerns for continued genital itching and irritation.  He was seen on 05/24/2023 and was tested here at this urgent care for STDs.  All testing came back negative.  He reports that his recent partner is also having similar symptoms that are getting worse.  Physical exam was reassuring today without signs of lesions, discharge, rashes.  Reviewed that he does have some mild dry skin along the shaft of the penis and I  recommend using moisturizing ointment such as Aquaphor or Vaseline to assist with this.  Will get cytology swab again to test for  gonorrhea, chlamydia, trichomonas.  Reviewed that his urine dip was reassuring and did not show signs of UTI or hematuria.  Results of cytology swab to dictate further management.  Recommend refraining from sexual activity until testing results are negative or he has completed an appropriate medication regimen.  Recommend using a condom or another barrier method for sexual activity going forward to prevent STD transmission.    Discharge Instructions      Your urine testing was negative for signs of UTI or blood in the urine.  We will keep you updated on the results of your STD testing.  If any medications are indicated by the results of the testing they will be sent into the pharmacy that we have on file.  Please refrain from sexual activity until your testing is negative or you have completed an appropriate medication regimen.  Please use a condom or another barrier method to prevent STD transmission going forward.     ED Prescriptions   None    PDMP not reviewed this encounter.   Providence Crosby, PA-C 06/06/23 [id number]

## 2023-06-06 NOTE — Discharge Instructions (Signed)
 Your urine testing was negative for signs of UTI or blood in the urine.  We will keep you updated on the results of your STD testing.  If any medications are indicated by the results of the testing they will be sent into the pharmacy that we have on file.  Please refrain from sexual activity until your testing is negative or you have completed an appropriate medication regimen.  Please use a condom or another barrier method to prevent STD transmission going forward.

## 2023-06-07 LAB — CYTOLOGY, (ORAL, ANAL, URETHRAL) ANCILLARY ONLY
Chlamydia: NEGATIVE
Comment: NEGATIVE
Comment: NEGATIVE
Comment: NORMAL
Neisseria Gonorrhea: NEGATIVE
Trichomonas: NEGATIVE

## 2024-02-05 ENCOUNTER — Ambulatory Visit (HOSPITAL_COMMUNITY)
Admission: EM | Admit: 2024-02-05 | Discharge: 2024-02-05 | Disposition: A | Discharge status: Home / Self Care | Attending: Emergency Medicine | Admitting: Emergency Medicine

## 2024-02-05 ENCOUNTER — Encounter (HOSPITAL_COMMUNITY): Payer: Self-pay | Admitting: Emergency Medicine

## 2024-02-05 DIAGNOSIS — N4889 Other specified disorders of penis: Secondary | ICD-10-CM | POA: Insufficient documentation

## 2024-02-05 DIAGNOSIS — J029 Acute pharyngitis, unspecified: Secondary | ICD-10-CM | POA: Diagnosis not present

## 2024-02-05 DIAGNOSIS — Z113 Encounter for screening for infections with a predominantly sexual mode of transmission: Secondary | ICD-10-CM | POA: Insufficient documentation

## 2024-02-05 LAB — POCT RAPID STREP A (OFFICE): Rapid Strep A Screen: NEGATIVE

## 2024-02-05 LAB — HIV ANTIBODY (ROUTINE TESTING W REFLEX): HIV Screen 4th Generation wRfx: NONREACTIVE

## 2024-02-05 NOTE — Discharge Instructions (Signed)
 Your rapid strep test was negative, we will send a culture of this just to confirm.  All of your other testing will return over the next few days and someone will call if results are positive or require any treatment. You can take 650 mg of Tylenol  and 400 to 600 mg of ibuprofen  every 6-8 hours as needed for sore throat. Follow-up with your primary care provider or return here as needed.

## 2024-02-05 NOTE — ED Triage Notes (Signed)
 Pt requesting STD testing. Reports has sore throat for 3 weeks.  Pt reports that had bumps on shaft of penis that was there last week for a couple days.

## 2024-02-05 NOTE — ED Provider Notes (Signed)
 [facility]    CSN: [id number] Arrival date & time: 02/05/24  1508      History   Chief Complaint Chief Complaint  Patient presents with   SEXUALLY TRANSMITTED DISEASE    HPI Brandon Olson. is a 38 y.o. male.   Patient presents with a sore throat for about 3 weeks.  Patient states that his sore throat was worse last week and has subsided some but still continues and would like STD testing just to be sure.  Patient states that last week he did have a couple bumps on the shaft of his penis as well.  Patient states he noticed the bumps and pus came out of them and then they healed over very quickly.  Patient states the bumps were not painful or itchy.  Patient states the bumps did not spread to any other part of the penis.  Patient states that he has had an issue like this in the past that resolved on its own as well.    Patient denies any history of genital herpes.  Patient denies any known exposures to genital herpes.  Patient also denies any penile discharge, penile/testicular pain or swelling, denies any bumps on his penis at this time, dysuria, hematuria, urinary frequency/urgency, and abdominal pain.    The history is provided by the patient and medical records.    Past Medical History:  Diagnosis Date   GERD (gastroesophageal reflux disease)     Patient Active Problem List   Diagnosis Date Noted   Abnormal liver ultrasound 04/26/2021   Pain of upper abdomen 04/24/2021   History of multiple gastric ulcers 04/24/2021   Abdominal cramping 05/26/2020   Decreased appetite 05/26/2020   History of Helicobacter infection 05/26/2020   Dyspepsia 05/26/2020   History of Helicobacter pylori infection 05/26/2020   Change in bowel habits 05/26/2020   Routine health maintenance 12/23/2012    Past Surgical History:  Procedure Laterality Date   UPPER GASTROINTESTINAL ENDOSCOPY         Home Medications    Prior to Admission medications   Not on File     Family History Family History  Problem Relation Age of Onset   Healthy Mother    Diabetes Mother    Healthy Father    Diabetes Maternal Grandmother    Colon cancer Neg Hx    Esophageal cancer Neg Hx    Inflammatory bowel disease Neg Hx    Liver disease Neg Hx    Pancreatic cancer Neg Hx    Rectal cancer Neg Hx    Stomach cancer Neg Hx     Social History Social History   Tobacco Use   Smoking status: Never   Smokeless tobacco: Never  Vaping Use   Vaping status: Never Used  Substance Use Topics   Alcohol use: Yes    Alcohol/week: 15.0 standard drinks of alcohol    Types: 5 Cans of beer, 10 Shots of liquor per week    Comment: social   Drug use: Yes    Types: Marijuana    Comment: 05/28/20, occasional     Allergies   Shrimp extract and Shrimp [shellfish allergy]   Review of Systems Review of Systems  Per HPI  Physical Exam Triage Vital Signs ED Triage Vitals [02/05/24 1632]  Encounter Vitals Group     BP (!) 140/93     Girls Systolic BP Percentile      Girls Diastolic BP Percentile      Boys Systolic BP  Percentile      Boys Diastolic BP Percentile      Pulse Rate (!) 56     Resp 14     Temp 98.1 F (36.7 C)     Temp Source Oral     SpO2 96 %     Weight      Height      Head Circumference      Peak Flow      Pain Score 0     Pain Loc      Pain Education      Exclude from Growth Chart    No data found.  Updated Vital Signs BP (!) 140/93 (BP Location: Left Arm)   Pulse (!) 56   Temp 98.1 F (36.7 C) (Oral)   Resp 14   SpO2 96%   Visual Acuity Right Eye Distance:   Left Eye Distance:   Bilateral Distance:    Right Eye Near:   Left Eye Near:    Bilateral Near:     Physical Exam Vitals and nursing note reviewed.  Constitutional:      General: He is awake.     Appearance: Normal appearance. He is well-developed and well-groomed.  HENT:     Mouth/Throat:     Mouth: Mucous membranes are moist.     Pharynx: Oropharynx is clear.  Posterior oropharyngeal erythema present. No pharyngeal swelling, oropharyngeal exudate or uvula swelling.     Tonsils: No tonsillar exudate.  Abdominal:     General: Abdomen is flat. Bowel sounds are normal. There is no distension.     Palpations: Abdomen is soft.     Tenderness: There is no abdominal tenderness. There is no right CVA tenderness, left CVA tenderness, guarding or rebound.  Genitourinary:    Comments: Exam deferred due to lack of bumps or concern at this time. Skin:    General: Skin is warm and dry.  Neurological:     Mental Status: He is alert.  Psychiatric:        Behavior: Behavior is cooperative.      UC Treatments / Results  Labs (all labs ordered are listed, but only abnormal results are displayed) Labs Reviewed  CULTURE, GROUP A STREP ([facility])  HIV ANTIBODY (ROUTINE TESTING W REFLEX)  RPR  POCT RAPID STREP A (OFFICE)  CYTOLOGY, (ORAL, ANAL, URETHRAL) ANCILLARY ONLY  CYTOLOGY, (ORAL, ANAL, URETHRAL) ANCILLARY ONLY    EKG   Radiology No results found.  Procedures Procedures (including critical care time)  Medications Ordered in UC Medications - No data to display  Initial Impression / Assessment and Plan / UC Course  I have reviewed the triage vital signs and the nursing notes.  Pertinent labs & imaging results that were available during my care of the patient were reviewed by me and considered in my medical decision making (see chart for details).     Patient is overall well-appearing.  Vitals are stable.  Mild erythema noted to posterior oropharynx.  No other significant findings on exam.  GU exam deferred.  Patient performed self swab for STD.  HIV and RPR ordered.  Oral cytology swab also performed to rule out presence of STD to throat.  Rapid strep was negative, will send culture to confirm.  Recommended Tylenol  and ibuprofen  as needed for sore throat.  Discussed follow-up and return precautions. Final Clinical Impressions(s) / UC Diagnoses    Final diagnoses:  Sore throat  Penile irritation  Screening for STD (sexually transmitted disease)  Discharge Instructions      Your rapid strep test was negative, we will send a culture of this just to confirm.  All of your other testing will return over the next few days and someone will call if results are positive or require any treatment. You can take 650 mg of Tylenol  and 400 to 600 mg of ibuprofen  every 6-8 hours as needed for sore throat. Follow-up with your primary care provider or return here as needed.     ED Prescriptions   None    PDMP not reviewed this encounter.   Johnie Flaming A, NP 02/05/24 1728

## 2024-02-06 LAB — RPR: RPR Ser Ql: NONREACTIVE

## 2024-02-06 LAB — CYTOLOGY, (ORAL, ANAL, URETHRAL) ANCILLARY ONLY
Chlamydia: NEGATIVE
Chlamydia: NEGATIVE
Comment: NEGATIVE
Comment: NORMAL
Comment: NEGATIVE
Comment: NEGATIVE
Comment: NORMAL
Neisseria Gonorrhea: NEGATIVE
Neisseria Gonorrhea: NEGATIVE
Trichomonas: NEGATIVE

## 2024-02-08 LAB — CULTURE, GROUP A STREP (THRC)

## 2024-02-09 ENCOUNTER — Ambulatory Visit (HOSPITAL_COMMUNITY): Payer: Self-pay
